# Patient Record
Sex: Female | Born: 1985
Health system: Southern US, Community
[De-identification: ages and names within clinical notes are randomized; demographics above are authoritative.]

## PROBLEM LIST (undated history)

## (undated) DIAGNOSIS — O039 Complete or unspecified spontaneous abortion without complication: Secondary | ICD-10-CM

## (undated) DIAGNOSIS — J45909 Unspecified asthma, uncomplicated: Secondary | ICD-10-CM

## (undated) DIAGNOSIS — F411 Generalized anxiety disorder: Secondary | ICD-10-CM

## (undated) DIAGNOSIS — Q796 Ehlers-Danlos syndrome, unspecified: Secondary | ICD-10-CM

## (undated) DIAGNOSIS — O021 Missed abortion: Secondary | ICD-10-CM

## (undated) DIAGNOSIS — F32A Depression, unspecified: Secondary | ICD-10-CM

## (undated) DIAGNOSIS — B019 Varicella without complication: Secondary | ICD-10-CM

## (undated) DIAGNOSIS — F329 Major depressive disorder, single episode, unspecified: Secondary | ICD-10-CM

## (undated) HISTORY — DX: Unspecified asthma, uncomplicated: J45.909

## (undated) HISTORY — PX: BREAST REDUCTION SURGERY: SHX8

## (undated) HISTORY — DX: Generalized anxiety disorder: F41.1

## (undated) HISTORY — DX: Major depressive disorder, single episode, unspecified: F32.9

## (undated) HISTORY — DX: Depression, unspecified: F32.A

## (undated) HISTORY — DX: Varicella without complication: B01.9

---

## 2007-04-01 HISTORY — PX: INDUCED ABORTION: SHX677

## 2007-04-01 HISTORY — PX: BREAST REDUCTION SURGERY: SHX8

## 2015-03-20 ENCOUNTER — Encounter (INDEPENDENT_AMBULATORY_CARE_PROVIDER_SITE_OTHER): Payer: Self-pay

## 2015-03-20 ENCOUNTER — Encounter: Payer: Self-pay | Admitting: Primary Care

## 2015-03-20 ENCOUNTER — Ambulatory Visit (INDEPENDENT_AMBULATORY_CARE_PROVIDER_SITE_OTHER): Payer: BLUE CROSS/BLUE SHIELD | Admitting: Primary Care

## 2015-03-20 VITALS — BP 120/72 | HR 87 | Temp 98.0°F | Ht 66.75 in | Wt 140.1 lb

## 2015-03-20 DIAGNOSIS — R002 Palpitations: Secondary | ICD-10-CM

## 2015-03-20 DIAGNOSIS — F411 Generalized anxiety disorder: Secondary | ICD-10-CM

## 2015-03-20 DIAGNOSIS — F329 Major depressive disorder, single episode, unspecified: Secondary | ICD-10-CM | POA: Insufficient documentation

## 2015-03-20 DIAGNOSIS — R450 Nervousness: Secondary | ICD-10-CM | POA: Diagnosis not present

## 2015-03-20 DIAGNOSIS — F419 Anxiety disorder, unspecified: Secondary | ICD-10-CM | POA: Insufficient documentation

## 2015-03-20 MED ORDER — ESCITALOPRAM OXALATE 10 MG PO TABS: 10.0000 mg | ORAL_TABLET | Freq: Every day | ORAL | Status: DC

## 2015-03-20 NOTE — Patient Instructions (Addendum)
Complete lab work prior to leaving today. I will notify you of your results once received.   Start Lexapro for anxiety. Take 1/2 tablet by mouth daily for 6 days, then advance to 1 full tablet thereafter. Take this medication in the morning.  Follow up in 6 weeks for re-evaluation.  It was a pleasure to meet you today! Please don't hesitate to call me with any questions. Welcome to [facility]!

## 2015-03-20 NOTE — Progress Notes (Signed)
   Subjective:    Patient ID: Sherry Salas, female    DOB: [birth year], 29 y.o.   MRN: [id number]  HPI  Sherry Salas is a 29 year old female who presents today to establish care and discuss the problems mentioned below. She hasn't seen a PCP in over 5 years nor has she completed a physical.  1) Depression and Anxiety: Diagnosed and managed on medication in college for depression. She was managed on Prozac for one year and came off as she felt improved.   She reports feeling jittery, anxious, nervous for the past year. She has tremendous stress at work as she's been promoted several times and has a lot of responsibility. She's also had bad dreams, palpitations, and muscle tension to her neck and shoulders. Her nervousness/anxiety will show with shaking to her hands and arms that she cannot control. Denies difficulty sleeping at night. GAD 7 score of 5 today.  Review of Systems  Constitutional: Negative for unexpected weight change.  Respiratory: Negative for shortness of breath.   Cardiovascular: Positive for palpitations. Negative for chest pain.  Endocrine: Positive for heat intolerance.  Psychiatric/Behavioral: Negative for suicidal ideas and sleep disturbance. The patient is nervous/anxious.        Past Medical History  Diagnosis Date  . Asthma   . Chickenpox   . Depression   . Generalized anxiety disorder     Social History   Social History  . Marital Status: Married    Spouse Name: N/A  . Number of Children: N/A  . Years of Education: N/A   Occupational History  . Not on file.   Social History Main Topics  . Smoking status: Current Every Day Smoker -- 0.50 packs/day    Types: Cigarettes  . Smokeless tobacco: Not on file  . Alcohol Use: 0.6 oz/week    1 Glasses of wine per week  . Drug Use: Not on file  . Sexual Activity: Not on file   Other Topics Concern  . Not on file   Social History Narrative   Married.   No children.   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     Enjoys painting.     Past Surgical History  Procedure Laterality Date  . Breast reduction surgery      Family History  Problem Relation Age of Onset  . Arthritis Mother   . Stroke Mother   . Depression Mother   . Alcohol abuse Maternal Grandfather   . Lung cancer Maternal Grandfather     Allergies  Allergen Reactions  . Sulfa Antibiotics     No current outpatient prescriptions on file prior to visit.   No current facility-administered medications on file prior to visit.    BP 120/72 mmHg  Pulse 87  Temp(Src) 98 F (36.7 C) (Oral)  Ht 5' 6.75" (1.695 m)  Wt 140 lb 1.9 oz (63.558 kg)  BMI 22.12 kg/m2  SpO2 98%  LMP 03/01/2015    Objective:   Physical Exam  Constitutional: She appears well-nourished.  Eyes: Pupils are equal, round, and reactive to light.  Neck: Neck supple. No thyromegaly present.  Cardiovascular: Normal rate and regular rhythm.   Pulmonary/Chest: Effort normal and breath sounds normal.  Musculoskeletal:  Noticed mild tremor to hands bilaterally. Appears to be a nevous shaking.  Skin: Skin is warm and dry.  Psychiatric: She has a normal mood and affect.          Assessment & Plan:

## 2015-03-20 NOTE — Assessment & Plan Note (Addendum)
Nervousness, anxiety, shaking of upper extremities intermittently x 1 year. Increased stress at work as she has a lot of responsibility.  Seems nervous during exam, obvious shaking of her upper extremities. Also with other symptoms that could be metabolically related. Will obtain TSH, Free T4, CBC, CMP today. No goiter noted.  Start Lexapro 10 mg tablets. Patient is to take 1/2 tablet daily for 6 days, then advance to 1 full tablet thereafter.  We discussed possible side effects of headache, GI upset, drowsiness, and SI/HI. If thoughts of SI/HI develop, we discussed to present to the emergency immediately. Patient verbalized understanding.   Will have her follow up in 6 weeks.

## 2015-03-20 NOTE — Progress Notes (Signed)
Pre visit review using our clinic review tool, if applicable. No additional management support is needed unless otherwise documented below in the visit note. 

## 2015-03-21 LAB — TSH: TSH: 0.33 u[IU]/mL — ABNORMAL LOW (ref 0.35–4.50)

## 2015-03-21 LAB — CBC
HCT: 43.4 % (ref 36.0–46.0)
Hemoglobin: 14.3 g/dL (ref 12.0–15.0)
MCHC: 32.9 g/dL (ref 30.0–36.0)
MCV: 96.2 fl (ref 78.0–100.0)
Platelets: 222 10*3/uL (ref 150.0–400.0)
RBC: 4.51 Mil/uL (ref 3.87–5.11)
RDW: 12.7 % (ref 11.5–15.5)
WBC: 8.1 10*3/uL (ref 4.0–10.5)

## 2015-03-21 LAB — T4, FREE: Free T4: 0.85 ng/dL (ref 0.60–1.60)

## 2015-03-21 LAB — COMPREHENSIVE METABOLIC PANEL
ALT: 17 U/L (ref 0–35)
AST: 25 U/L (ref 0–37)
Albumin: 4.5 g/dL (ref 3.5–5.2)
Alkaline Phosphatase: 52 U/L (ref 39–117)
BILIRUBIN TOTAL: 0.3 mg/dL (ref 0.2–1.2)
BUN: 12 mg/dL (ref 6–23)
CO2: 27 meq/L (ref 19–32)
CREATININE: 0.72 mg/dL (ref 0.40–1.20)
Calcium: 10.2 mg/dL (ref 8.4–10.5)
Chloride: 102 mEq/L (ref 96–112)
GFR: 102.74 mL/min (ref >60.00)
GLUCOSE: 85 mg/dL (ref 70–99)
Potassium: 4.1 mEq/L (ref 3.5–5.1)
SODIUM: 139 meq/L (ref 135–145)
Total Protein: 7.4 g/dL (ref 6.0–8.3)

## 2015-03-22 ENCOUNTER — Encounter: Payer: Self-pay | Admitting: *Deleted

## 2015-05-02 ENCOUNTER — Ambulatory Visit: Payer: BLUE CROSS/BLUE SHIELD | Admitting: Primary Care

## 2015-05-03 ENCOUNTER — Ambulatory Visit (INDEPENDENT_AMBULATORY_CARE_PROVIDER_SITE_OTHER): Payer: BLUE CROSS/BLUE SHIELD | Admitting: Primary Care

## 2015-05-03 VITALS — BP 146/84 | HR 74 | Temp 98.1°F | Ht 67.0 in | Wt 140.1 lb

## 2015-05-03 DIAGNOSIS — F411 Generalized anxiety disorder: Secondary | ICD-10-CM | POA: Diagnosis not present

## 2015-05-03 DIAGNOSIS — R002 Palpitations: Secondary | ICD-10-CM

## 2015-05-03 LAB — TSH: TSH: 1.16 u[IU]/mL (ref 0.35–4.50)

## 2015-05-03 LAB — T4, FREE: FREE T4: 0.9 ng/dL (ref 0.60–1.60)

## 2015-05-03 MED ORDER — ESCITALOPRAM OXALATE 20 MG PO TABS: 20.0000 mg | ORAL_TABLET | Freq: Every day | ORAL | Status: DC

## 2015-05-03 MED ORDER — PROPRANOLOL HCL 10 MG PO TABS: ORAL_TABLET | ORAL | Status: DC

## 2015-05-03 NOTE — Progress Notes (Signed)
   Subjective:    Patient ID: Sherry Salas, female    DOB: [birth year], 30 y.o.   MRN: [id number]  HPI  [name] is a 30 year old female who presents today for follow up of anxiety. She was evaluated as a new patient on 03/20/15 with complaints of nervousness, anxiety, shaking of her upper extremities that had been present for the past year. 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   She was initiated on Lexapro 10 mg tablets last visit due to negative work up for other metabolic causes of tremor and anxiety. Since her last visit she's not noticed a change in her shakiness or anxiety. She will continue to shake everyday, but doesn't feel anxious everyday. Her anxiety will increase exponentially with stress whether it be related to work or family. She will have episodes of anxiety with palpitations, hives, increased shaking.This has occurred about 20 times since her last visit. Denies headaches, GI upset, SI/HI.     Review of Systems  Respiratory: Negative for shortness of breath.   Cardiovascular: Positive for palpitations. Negative for chest pain.  Gastrointestinal: Negative for abdominal pain.  Neurological: Positive for tremors. Negative for headaches.  Psychiatric/Behavioral: Negative for suicidal ideas, sleep disturbance and decreased concentration. The patient is nervous/anxious.        Past Medical History  Diagnosis Date  . Asthma   . Chickenpox   . Depression   . Generalized anxiety disorder     Social History   Social History  . Marital Status: Married    Spouse Name: N/A  . Number of Children: N/A  . Years of Education: N/A   Occupational History  . Not on file.   Social History Main Topics  . Smoking status: Current Every Day Smoker -- 0.50 packs/day    Types: Cigarettes  . Smokeless tobacco: Not on file  . Alcohol Use: 0.6 oz/week    1 Glasses of wine per week  . Drug Use: Not  on file  . Sexual Activity: Not on file   Other Topics Concern  . Not on file   Social History Narrative   Married.   No children.   Works as a Designer, television/film set for [employer].   Enjoys painting.     Past Surgical History  Procedure Laterality Date  . Breast reduction surgery      Family History  Problem Relation Age of Onset  . Arthritis Mother   . Stroke Mother   . Depression Mother   . Alcohol abuse Maternal Grandfather   . Lung cancer Maternal Grandfather     Allergies  Allergen Reactions  . Sulfa Antibiotics     No current outpatient prescriptions on file prior to visit.   No current facility-administered medications on file prior to visit.    BP 146/84 mmHg  Pulse 74  Temp(Src) 98.1 F (36.7 C) (Oral)  Ht  (1.702 m)  Wt 140 lb 1.9 oz (63.558 kg)  BMI 21.94 kg/m2  SpO2 98%  LMP 05/03/2015    Objective:   Physical Exam  Constitutional: She appears well-nourished.  Cardiovascular: Normal rate and regular rhythm.   Pulmonary/Chest: Effort normal and breath sounds normal.  Neurological:  Tremors to upper extremities present today.  Skin: Skin is warm and dry.  Psychiatric: She has a normal mood and affect.          Assessment & Plan:

## 2015-05-03 NOTE — Patient Instructions (Signed)
We've increased the dose of your Lexapro from 10 mg to 20 mg. You may take 2 of your 10 mg tablets until your bottle is empty. I sent a new prescription of the 20 mg to your pharmacy.  Try taking Propranolol 10 mg tablets 1 hour prior to meeting, presentation, event. Please call me if you start feeling dizzy, lightheaded.   Complete lab work prior to leaving today. I will notify you of your results once received.   Follow up in 4 weeks for re-evaluation.  It was a pleasure to see you today!

## 2015-05-03 NOTE — Assessment & Plan Note (Signed)
No improvement with Lexapro 10 mg. 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 Tremor diagnosed as a child, but is more prominent during stressful periods. Will increase Lexapro to 20 mg daily. Add in propranolol 10 mg for her to take just prior to a meeting or presentation to help with palpitations and anxiety. Precautions provided regarding beta blocker. Follow up in 4 weeks.

## 2015-05-03 NOTE — Progress Notes (Signed)
Pre visit review using our clinic review tool, if applicable. No additional management support is needed unless otherwise documented below in the visit note. 

## 2015-05-07 ENCOUNTER — Ambulatory Visit: Payer: BLUE CROSS/BLUE SHIELD | Admitting: Primary Care

## 2015-05-31 ENCOUNTER — Ambulatory Visit: Payer: BLUE CROSS/BLUE SHIELD | Admitting: Primary Care

## 2015-06-04 ENCOUNTER — Ambulatory Visit: Payer: BLUE CROSS/BLUE SHIELD | Admitting: Primary Care

## 2015-06-04 ENCOUNTER — Telehealth: Payer: Self-pay | Admitting: Primary Care

## 2015-06-04 NOTE — Telephone Encounter (Signed)
Yes, please schedule follow up at her earliest convenience. Please do not charge no show fee.

## 2015-06-04 NOTE — Telephone Encounter (Signed)
Patient did not come for their scheduled appointment today for 4 week follow up.  Please let me know if the patient needs to be contacted immediately for follow up or if no follow up is necessary.   ° °

## 2015-06-06 NOTE — Telephone Encounter (Signed)
3/13  Pt aware

## 2015-06-11 ENCOUNTER — Ambulatory Visit (INDEPENDENT_AMBULATORY_CARE_PROVIDER_SITE_OTHER): Payer: BLUE CROSS/BLUE SHIELD | Admitting: Primary Care

## 2015-06-11 ENCOUNTER — Ambulatory Visit: Payer: BLUE CROSS/BLUE SHIELD | Admitting: Primary Care

## 2015-06-11 ENCOUNTER — Encounter: Payer: Self-pay | Admitting: Primary Care

## 2015-06-11 VITALS — BP 118/76 | HR 82 | Temp 98.3°F | Ht 67.0 in | Wt 141.0 lb

## 2015-06-11 DIAGNOSIS — F411 Generalized anxiety disorder: Secondary | ICD-10-CM

## 2015-06-11 DIAGNOSIS — R002 Palpitations: Secondary | ICD-10-CM | POA: Diagnosis not present

## 2015-06-11 MED ORDER — VENLAFAXINE HCL ER 37.5 MG PO CP24: 37.5000 mg | ORAL_CAPSULE | Freq: Every day | ORAL | Status: DC

## 2015-06-11 MED ORDER — PROPRANOLOL HCL 10 MG PO TABS
ORAL_TABLET | ORAL | Status: DC
Start: 2015-06-11 — End: 2015-07-30

## 2015-06-11 NOTE — Progress Notes (Signed)
   Subjective:    Patient ID: Sherry CurryKristen Paul, female    DOB: 01/30/1986, 30 y.o.   MRN: [id number]  HPI  [name] is a 30 year old female who presents today for follow up of anxiety. She is currently managed on Lexapro 20 mg for Generalized Anxiety/Social anxiety that was increased last visit from 10 mg. She is also managed on Propranolol 10 mg that she will take prior to work events/presentations/meetings.  Since her last visit she's noticed a decreased frequency of her anxiety which will occur 2 days weekly as opposed to 5 days weekly. 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 She does not experience this level of anxiety with her staff in the office. She's also not noticed a difference in the propranolol that she's taking 1 hour prior to work events.  She does continue to experience odd dreams since starting the Lexapro. She doesn't feel as though she's getting rest at night due to these odd dreams. Denies SI/HI and panic attacks. Overall she's feeling improved, but continues to feel her bouts of anxiety during her work events.  Review of Systems  Gastrointestinal: Negative for nausea.  Neurological: Negative for headaches.       History of tremor. Increased with anxiety  Psychiatric/Behavioral: Positive for sleep disturbance. Negative for suicidal ideas. The patient is nervous/anxious.        Past Medical History  Diagnosis Date  . Asthma   . Chickenpox   . Depression   . Generalized anxiety disorder     Social History   Social History  . Marital Status: Married    Spouse Name: N/A  . Number of Children: N/A  . Years of Education: N/A   Occupational History  . Not on file.   Social History Main Topics  . Smoking status: Current Every Day Smoker -- 0.50 packs/day    Types: Cigarettes  . Smokeless tobacco: Not on file  . Alcohol Use: 0.6 oz/week    1 Glasses of wine per week  . Drug Use: Not on  file  . Sexual Activity: Not on file   Other Topics Concern  . Not on file   Social History Narrative   Married.   No children.   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   Enjoys painting.     Past Surgical History  Procedure Laterality Date  . Breast reduction surgery      Family History  Problem Relation Age of Onset  . Arthritis Mother   . Stroke Mother   . Depression Mother   . Alcohol abuse Maternal Grandfather   . Lung cancer Maternal Grandfather     Allergies  Allergen Reactions  . Sulfa Antibiotics     No current outpatient prescriptions on file prior to visit.   No current facility-administered medications on file prior to visit.    BP 118/76 mmHg  Pulse 82  Temp(Src) 98.3 F (36.8 C) (Oral)  Ht 5\' 7"  (1.702 m)  Wt 141 lb (63.957 kg)  BMI 22.08 kg/m2  SpO2 99%  LMP 06/08/2015    Objective:   Physical Exam  Constitutional: She appears well-nourished.  Cardiovascular: Normal rate and regular rhythm.   Pulmonary/Chest: Effort normal and breath sounds normal.  Skin: Skin is warm and dry.  Psychiatric: She has a normal mood and affect.          Assessment & Plan:

## 2015-06-11 NOTE — Assessment & Plan Note (Signed)
Somewhat improvement on increased dose of Lexapro, but continues to have anxiety during events. No improvement with propranolol.  Stop Lexapro. Will try Effexor XR once daily.  Increase propranolol to 20 mg one hour prior to events.  She is to update me in 1 month in regards to anxiety. Denies SI/HI.

## 2015-06-11 NOTE — Progress Notes (Signed)
Pre visit review using our clinic review tool, if applicable. No additional management support is needed unless otherwise documented below in the visit note. 

## 2015-06-11 NOTE — Patient Instructions (Signed)
Stop Lexapro as discussed. Start Effexor XR (Venlafaxine) 37.5 mg for anxiety. Take 1 capsule by mouth every morning.   Increase Propranolol to 2 tablets by mouth 1 hour prior to an event or presentation.  Please provide me with an update in 1 month.  It was a pleasure to see you today!

## 2015-06-24 ENCOUNTER — Other Ambulatory Visit: Payer: Self-pay | Admitting: Primary Care

## 2015-06-24 DIAGNOSIS — F418 Other specified anxiety disorders: Secondary | ICD-10-CM

## 2015-06-25 ENCOUNTER — Ambulatory Visit (INDEPENDENT_AMBULATORY_CARE_PROVIDER_SITE_OTHER): Payer: BLUE CROSS/BLUE SHIELD | Admitting: Internal Medicine

## 2015-06-25 ENCOUNTER — Encounter: Payer: Self-pay | Admitting: Internal Medicine

## 2015-06-25 VITALS — BP 120/78 | HR 96 | Temp 98.2°F | Resp 12 | Ht 67.0 in | Wt 139.8 lb

## 2015-06-25 DIAGNOSIS — R05 Cough: Secondary | ICD-10-CM

## 2015-06-25 DIAGNOSIS — J111 Influenza due to unidentified influenza virus with other respiratory manifestations: Secondary | ICD-10-CM | POA: Diagnosis not present

## 2015-06-25 DIAGNOSIS — R059 Cough, unspecified: Secondary | ICD-10-CM

## 2015-06-25 LAB — POCT INFLUENZA A/B
Influenza A, POC: NEGATIVE
Influenza B, POC: NEGATIVE

## 2015-06-25 MED ORDER — OSELTAMIVIR PHOSPHATE 75 MG PO CAPS: 75.0000 mg | ORAL_CAPSULE | Freq: Two times a day (BID) | ORAL | Status: DC

## 2015-06-25 MED ORDER — LEVOFLOXACIN 500 MG PO TABS: 500.0000 mg | ORAL_TABLET | Freq: Every day | ORAL | Status: DC

## 2015-06-25 MED ORDER — HYDROCODONE-GUAIFENESIN 2.5-200 MG/5ML PO SOLN: 5.0000 mL | Freq: Two times a day (BID) | ORAL | Status: DC | PRN

## 2015-06-25 NOTE — Progress Notes (Signed)
Subjective:  Patient ID: Sherry Salas, female    DOB: [birth year]  Age: 30 y.o. MRN: [id number]  CC: The primary encounter diagnosis was Cough. A diagnosis of Influenza was also pertinent to this visit.  HPI Sherry Salas presents for flu like symptoms which began after exposure to two influenza infected patients.   Symptoms of body aches, fevers,  Sweats,  Cough,  Started < 24 hours ago .   Outpatient Prescriptions Prior to Visit  Medication Sig Dispense Refill  . propranolol (INDERAL) 10 MG tablet Take 1-2 tablets by mouth 1 hour prior to presentation or event. 30 tablet 0  . propranolol (INDERAL) 10 MG tablet TAKE 1 TABLET BY MOUTH 1 HOUR PRIOR TO PRESENTATION OR EVENT. 30 tablet 0  . venlafaxine XR (EFFEXOR XR) 37.5 MG 24 hr capsule Take 1 capsule (37.5 mg total) by mouth daily with breakfast. 30 capsule 3   No facility-administered medications prior to visit.    Review of Systems;  Patient denies headache, fevers, malaise, unintentional weight loss, skin rash, eye pain, sinus congestion and sinus pain, sore throat, dysphagia,  hemoptysis , cough, dyspnea, wheezing, chest pain, palpitations, orthopnea, edema, abdominal pain, nausea, melena, diarrhea, constipation, flank pain, dysuria, hematuria, urinary  Frequency, nocturia, numbness, tingling, seizures,  Focal weakness, Loss of consciousness,  Tremor, insomnia, depression, anxiety, and suicidal ideation.      Objective:  BP 120/78 mmHg  Pulse 96  Temp(Src) 98.2 F (36.8 C) (Oral)  Resp 12  Ht 5\' 7"  (1.702 m)  Wt 139 lb 12 oz (63.39 kg)  BMI 21.88 kg/m2  SpO2 99%  LMP 06/08/2015  BP Readings from Last 3 Encounters:  06/25/15 120/78  06/11/15 118/76  05/03/15 146/84    Wt Readings from Last 3 Encounters:  06/25/15 139 lb 12 oz (63.39 kg)  06/11/15 141 lb (63.957 kg)  05/03/15 140 lb 1.9 oz (63.558 kg)    General appearance: alert, cooperative and appears stated age. Sick appearing  Ears: normal TM's and external ear  canals both ears Throat: lips, mucosa, and tongue normal; teeth and gums normal. Tonsillar erythema  Neck: no adenopathy, no carotid bruit, supple, symmetrical, trachea midline and thyroid not enlarged, symmetric, no tenderness/mass/nodules Back: symmetric, no curvature. ROM normal. No CVA tenderness. Lungs: clear to auscultation bilaterally Heart: regular rate and rhythm, S1, S2 normal, no murmur, click, rub or gallop Abdomen: soft, non-tender; bowel sounds normal; no masses,  no organomegaly Pulses: 2+ and symmetric Skin: Skin color, texture, turgor normal. No rashes or lesions Lymph nodes: Cervical, supraclavicular, and axillary nodes tender and enlarged .  No results found for: HGBA1C  Lab Results  Component Value Date   CREATININE 0.72 03/20/2015    Lab Results  Component Value Date   WBC 8.1 03/20/2015   HGB 14.3 03/20/2015   HCT 43.4 03/20/2015   PLT 222.0 03/20/2015   GLUCOSE 85 03/20/2015   ALT 17 03/20/2015   AST 25 03/20/2015   NA 139 03/20/2015   K 4.1 03/20/2015   CL 102 03/20/2015   CREATININE 0.72 03/20/2015   BUN 12 03/20/2015   CO2 27 03/20/2015   TSH 1.16 05/03/2015    Patient was never admitted.  Assessment & Plan:   Problem List Items Addressed This Visit    Influenza    Patient appears to be infected and will treat despite negative rapid influenza results.  Work note, tamiflu, tussionex given.       Relevant Medications   oseltamivir (TAMIFLU) 75 MG capsule  Other Visit Diagnoses    Cough    -  Primary    Relevant Orders    POCT Influenza A/B (Completed)       I am having Ms. Salas start on oseltamivir and levofloxacin. I am also having her maintain her venlafaxine XR, propranolol, and propranolol.  Meds ordered this encounter  Medications  . oseltamivir (TAMIFLU) 75 MG capsule    Sig: Take 1 capsule (75 mg total) by mouth 2 (two) times daily.    Dispense:  10 capsule    Refill:  0  . DISCONTD: HYDROcodone-GuaiFENesin 2.5-200 MG/5ML  SOLN    Sig: Take 5 mLs by mouth every 12 (twelve) hours as needed.    Dispense:  118 mL    Refill:  0  . levofloxacin (LEVAQUIN) 500 MG tablet    Sig: Take 1 tablet (500 mg total) by mouth daily.    Dispense:  7 tablet    Refill:  0    There are no discontinued medications.  Follow-up: No Follow-up on file.   Sherlene Shams, MD

## 2015-06-25 NOTE — Telephone Encounter (Signed)
Last filled 06/11/2015---pt is taking on PRN basis?--is it okay to refill?--please advise

## 2015-06-25 NOTE — Patient Instructions (Signed)
You have the flu is causing bronchitis.    The post nasal drip may also be contributing to  your  Cough.  I am prescribing Tamiflue,  To start tonight and take twice daily for 5 days.  I also advise use of the following OTC meds to help with your other symptoms.   Continue generic OTC benadryl 25 mg  At bedtime for the drainage, Sudafed PE 10 to 30 mg every 6 hours for sinus congestion and a daytime anthistamine for your allergies.  You can use OTC Delsym for daytime cough.  Your coughing may last 7 to 10 days despite using cough suppressants.  I have given you strong cough syrup that has hydrocodone in it   To use for severe cough.   It may constipate you so make sure you use something for that   If you develop  a low grade fever ( T > 100.4, )  but if you develop Green nasal discharge,  Ear  Or facial pain, start the antibiotic I have given you a prescription for  (Levaquin)   Please take a probiotic ( Align, Floraque or Culturelle) if you  Start the antibiotic to prevent a serious antibiotic associated diarrhea  Called clostridium dificile colitis and a vaginal yeast infection

## 2015-06-25 NOTE — Progress Notes (Signed)
Pre-visit discussion using our clinic review tool. No additional management support is needed unless otherwise documented below in the visit note.  

## 2015-06-26 ENCOUNTER — Telehealth: Payer: Self-pay | Admitting: *Deleted

## 2015-06-26 ENCOUNTER — Other Ambulatory Visit: Payer: Self-pay | Admitting: Internal Medicine

## 2015-06-26 DIAGNOSIS — J111 Influenza due to unidentified influenza virus with other respiratory manifestations: Secondary | ICD-10-CM | POA: Insufficient documentation

## 2015-06-26 MED ORDER — BENZONATATE 200 MG PO CAPS: 200.0000 mg | ORAL_CAPSULE | Freq: Three times a day (TID) | ORAL | Status: DC | PRN

## 2015-06-26 MED ORDER — HYDROCOD POLST-CPM POLST ER 10-8 MG/5ML PO SUER: 5.0000 mL | Freq: Every evening | ORAL | Status: DC | PRN

## 2015-06-26 NOTE — Telephone Encounter (Signed)
Patient stated that the pharmacy will not recognize her prescription for HYDROcodone-GuaiFENesin 2.5-200 MG/5ML  [facility]

## 2015-06-26 NOTE — Assessment & Plan Note (Signed)
Patient appears to be infected and will treat despite negative rapid influenza results.  Work note, tamiflu, tussionex given.

## 2015-06-26 NOTE — Telephone Encounter (Signed)
Pt given two new scripts by Dr. Darrick Huntsmanullo in place of the one she could not get filled

## 2015-07-09 ENCOUNTER — Telehealth: Payer: Self-pay | Admitting: Primary Care

## 2015-07-09 NOTE — Telephone Encounter (Signed)
Will you please check on [name]? We switched her Lexapro to Effexor last visit. We also increased her propranolol to 20 mg as needed prior to work events. How's she doing?

## 2015-07-09 NOTE — Telephone Encounter (Signed)
Called patient and asked how she is doing. Patient stated that she doing good right the increased in propranolol and doing okay on Effexor also it is getting more to the stressful time at work so will let us know then any differences.

## 2015-07-09 NOTE — Telephone Encounter (Signed)
Noted  

## 2015-07-28 ENCOUNTER — Other Ambulatory Visit: Payer: Self-pay | Admitting: Primary Care

## 2015-08-27 ENCOUNTER — Other Ambulatory Visit: Payer: Self-pay | Admitting: Primary Care

## 2015-08-27 DIAGNOSIS — F418 Other specified anxiety disorders: Secondary | ICD-10-CM

## 2015-08-28 NOTE — Telephone Encounter (Signed)
Electronically refill request for   propranolol (INDERAL) 10 MG tablet   TAKE 1 TABLET BY MOUTH 1 HOUR PRIOR TO PRESENTATION OR EVENT.  Dispense: 30 tablet   Refills: 0     Last prescribed on 07/30/2015. Last seen on 06/25/2015. No future appt.

## 2015-09-19 ENCOUNTER — Telehealth: Payer: Self-pay

## 2015-09-19 NOTE — Telephone Encounter (Signed)
Pt has been having elevated heart rate (90-100) for awhile; No CP,SOB,H/A; pt had been feeling dizzy but last 2 days no dizziness. Pt scheduled appt 09/21/15 at 2:45 to see Mayra ReelKate Clark NP. This was 1st appt was convenient for pt to come in; if pt condition changes or worsens pt will cb for sooner appt. FYI to Mayra ReelKate Clark NP.

## 2015-09-19 NOTE — Telephone Encounter (Signed)
Noted, appropriate to wait until June 23 as heart rate is still within normal range.

## 2015-09-21 ENCOUNTER — Ambulatory Visit (INDEPENDENT_AMBULATORY_CARE_PROVIDER_SITE_OTHER): Payer: BLUE CROSS/BLUE SHIELD | Admitting: Primary Care

## 2015-09-21 ENCOUNTER — Encounter: Payer: Self-pay | Admitting: Primary Care

## 2015-09-21 VITALS — BP 122/78 | HR 105 | Temp 98.3°F | Ht 67.0 in | Wt 137.4 lb

## 2015-09-21 DIAGNOSIS — F411 Generalized anxiety disorder: Secondary | ICD-10-CM | POA: Diagnosis not present

## 2015-09-21 DIAGNOSIS — R002 Palpitations: Secondary | ICD-10-CM

## 2015-09-21 MED ORDER — PROPRANOLOL HCL ER 60 MG PO CP24: 60.0000 mg | ORAL_CAPSULE | Freq: Every day | ORAL | Status: DC

## 2015-09-21 NOTE — Assessment & Plan Note (Signed)
Increased heart rate and palpitations with Effexor. Could not tolerate Lexapro. Long discussion today in regards to anxiety as she feels overall better off of her medication. Suspect she is experiencing more palpitations than actual anxiety. Will try low-dose extended release propanolol daily for the next month to see if her palpitations and tremor improve.

## 2015-09-21 NOTE — Progress Notes (Signed)
Pre visit review using our clinic review tool, if applicable. No additional management support is needed unless otherwise documented below in the visit note. 

## 2015-09-21 NOTE — Progress Notes (Signed)
   Subjective:    Patient ID: Sherry Salas, female    DOB: Feb 10, 1986, 30 y.o.   MRN: [id number]  HPI  Sherry Salas is a 30 year old female who presents today with a chief complaint of palpitations. She is currently managed on Effexor, that was switched from Lexapro last visit, which has caused an increase in heart rate and palpitations shortly after she consumes. Since she stopped taking Effexor about 1 week ago her HR and palpitations have improvement. She continues to experience palpitations as before and also continues to experience a tremor. Overall she believes her anxiety is much improved. Denies chest pain, shortness of breath, headaches. She has not noticed much of a difference in stress and anxiety when taking propanolol 10 mg prior to her work events.  Review of Systems  Respiratory: Negative for shortness of breath.   Cardiovascular: Positive for palpitations. Negative for chest pain.  Neurological: Negative for dizziness, syncope and headaches.       Past Medical History  Diagnosis Date  . Asthma   . Chickenpox   . Depression   . Generalized anxiety disorder      Social History   Social History  . Marital Status: Married    Spouse Name: N/A  . Number of Children: N/A  . Years of Education: N/A   Occupational History  . Not on file.   Social History Main Topics  . Smoking status: Current Every Day Smoker -- 0.50 packs/day    Types: Cigarettes  . Smokeless tobacco: Not on file  . Alcohol Use: 0.6 oz/week    1 Glasses of wine per week  . Drug Use: Not on file  . Sexual Activity: Not on file   Other Topics Concern  . Not on file   Social History Narrative   Married.   No children.   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   Enjoys painting.     Past Surgical History  Procedure Laterality Date  . Breast reduction surgery      Family History  Problem Relation Age of Onset  . Arthritis Mother   . Stroke Mother   . Depression Mother   . Alcohol abuse  Maternal Grandfather   . Lung cancer Maternal Grandfather     Allergies  Allergen Reactions  . Sulfa Antibiotics     Current Outpatient Prescriptions on File Prior to Visit  Medication Sig Dispense Refill  . oseltamivir (TAMIFLU) 75 MG capsule Take 1 capsule (75 mg total) by mouth 2 (two) times daily. 10 capsule 0  . venlafaxine XR (EFFEXOR XR) 37.5 MG 24 hr capsule Take 1 capsule (37.5 mg total) by mouth daily with breakfast. (Patient not taking: Reported on 09/21/2015) 30 capsule 3   No current facility-administered medications on file prior to visit.    BP 122/78 mmHg  Pulse 105  Temp(Src) 98.3 F (36.8 C) (Oral)  Ht 5\' 7"  (1.702 m)  Wt 137 lb 6.4 oz (62.324 kg)  BMI 21.51 kg/m2  SpO2 98%  LMP 09/02/2015 (Within Days)    Objective:   Physical Exam  Constitutional: She appears well-nourished.  Cardiovascular: Normal rate and regular rhythm.   No murmur heard. Pulmonary/Chest: Effort normal and breath sounds normal.  Skin: Skin is warm and dry.  Psychiatric: She has a normal mood and affect.          Assessment & Plan:

## 2015-09-21 NOTE — Assessment & Plan Note (Signed)
Ongoing. Negative workup. Will try extended release propanolol 60 mg daily to see if that helps.

## 2015-09-21 NOTE — Patient Instructions (Signed)
Start Propranolol ER 60 mg daily. Take 1 tablet by mouth every morning.  Please call me with an update in 2-3 weeks. Also, notify me sooner if you develop weakness, fatigue, dizziness.  It was a pleasure to see you today!

## 2015-10-05 ENCOUNTER — Telehealth: Payer: Self-pay | Admitting: Primary Care

## 2015-10-05 NOTE — Telephone Encounter (Signed)
-----   Message from Doreene NestKatherine K Jenasia Dolinar, NP sent at 09/21/2015  3:05 PM EDT ----- Regarding: Palpitations Has she noticed an improvement in palpitations since we switched to Propranolol ER 60 mg?

## 2015-10-05 NOTE — Telephone Encounter (Signed)
Spoken to patient and she stated that she is doing better with propranolol ER 60 mg. Patient said so far she is doing good.

## 2015-10-05 NOTE — Telephone Encounter (Signed)
Very glad to hear.

## 2015-11-09 ENCOUNTER — Telehealth: Payer: Self-pay

## 2015-11-09 DIAGNOSIS — F411 Generalized anxiety disorder: Secondary | ICD-10-CM

## 2015-11-09 NOTE — Telephone Encounter (Signed)
Pt last seen 09/21/15; recently propranolol was making pt feel like she could feel the veins in her neck pumping/ pt has not taken BP; pt feeling more anxious but thinks work related. No CP,H/A or SOB but last weekend pt did have lightheadedness and pt stopped propranolol last weekend; no lightheadness or feeling veins in neck pumping since stopped med. Pt is aware Mayra ReelKate Clark NP is not in office today and pt request cb next week. If pt condition changes or worsens pt will call office or go to UC. Pt voiced understanding.

## 2015-11-11 NOTE — Telephone Encounter (Signed)
Noted. Agree that it's best to hold Propranolol for now, but this should have been weaned down rather than abruptly stopped. Please notify [name] that we've tried several medication options that seem to cause worsening symptoms. Would she be interested in meeting with a therapist for further evaluation?

## 2015-11-12 NOTE — Telephone Encounter (Signed)
Spoken and notified patient of Kate's comments. Patient verbalized understanding. 

## 2015-11-12 NOTE — Telephone Encounter (Signed)
Spoken and notified patient of Kate's comments. Patient verbalized understanding.  Patient wants to know how she weaned down. Patient stated that she agrees to therapist.

## 2015-11-12 NOTE — Telephone Encounter (Signed)
No need to wean down at this point as she's already been off of medication. I have placed a referral for therapy. Please have her call me if she does not notice improvement with therapy in the future.

## 2015-11-14 ENCOUNTER — Telehealth: Payer: Self-pay | Admitting: Primary Care

## 2015-11-14 NOTE — Telephone Encounter (Signed)
Pt placed on [facility] WQ. Pt will call and scheduled with either Dr. Laymond PurserPerrin or [facility]

## 2015-12-11 ENCOUNTER — Ambulatory Visit (INDEPENDENT_AMBULATORY_CARE_PROVIDER_SITE_OTHER): Payer: BLUE CROSS/BLUE SHIELD | Admitting: Psychology

## 2015-12-11 DIAGNOSIS — F4322 Adjustment disorder with anxiety: Secondary | ICD-10-CM

## 2015-12-16 ENCOUNTER — Other Ambulatory Visit: Payer: Self-pay | Admitting: Primary Care

## 2015-12-16 DIAGNOSIS — R002 Palpitations: Secondary | ICD-10-CM

## 2016-01-01 ENCOUNTER — Ambulatory Visit: Payer: BLUE CROSS/BLUE SHIELD | Admitting: Psychology

## 2016-12-17 ENCOUNTER — Encounter: Payer: Self-pay | Admitting: Family Medicine

## 2016-12-17 ENCOUNTER — Ambulatory Visit (INDEPENDENT_AMBULATORY_CARE_PROVIDER_SITE_OTHER): Payer: BLUE CROSS/BLUE SHIELD | Admitting: Family Medicine

## 2016-12-17 DIAGNOSIS — F411 Generalized anxiety disorder: Secondary | ICD-10-CM

## 2016-12-17 MED ORDER — SERTRALINE HCL 50 MG PO TABS: 50.0000 mg | ORAL_TABLET | Freq: Every day | ORAL | 3 refills | Status: DC

## 2016-12-17 MED ORDER — CLONAZEPAM 0.5 MG PO TABS: 0.2500 mg | ORAL_TABLET | Freq: Two times a day (BID) | ORAL | 0 refills | Status: DC | PRN

## 2016-12-17 NOTE — Progress Notes (Signed)
Marikay Alar, MD Phone: [phone]  Sherry Salas is a 31 y.o. female who presents today for follow-up and establish care visit.  Patient previously seen at our [facility] office by one of the nurse practitioners. She transfers care here to get a second opinion regarding her anxiety. She notes over the last 3 years this has become a significant issue and has become quite debilitating. She was on Prozac for depression in her teenage years though was switched to Lexapro when her anxiety became more of an issue. She had odd dreams on that and then was switched to Effexor which caused palpitations. She reports having had lab workup for that revealing no adverse causes. Notes her anxiety has gotten worse. She has seen a therapist in the past. She was placed on propranolol with little benefit. She notes her anxiety is not that bad when she is on the couch at home though when she is out doing anything it gets worse. Notes work makes her anxious. Reports her anxiety was so bad at some point she asked her husband for a divorce even though she really didn't want to do that. Really anything now is making her anxious. She feels it is somewhat irrational. Has had 3 panic attacks over the last 3 years which are described as shaking, increased heart rate, sweating, and face and body getting red. No SI or HI. No depression.   ROS see history of present illness  Objective  Physical Exam Vitals:   12/17/16 1357 12/17/16 1424  BP: 124/82   Pulse: (!) 122 84  Temp: 98.9 F (37.2 C)   SpO2: 97%     BP Readings from Last 3 Encounters:  12/17/16 124/82  09/21/15 122/78  06/25/15 120/78   Wt Readings from Last 3 Encounters:  12/17/16 137 lb 9.6 oz (62.4 kg)  09/21/15 137 lb 6.4 oz (62.3 kg)  06/25/15 139 lb 12 oz (63.4 kg)    Physical Exam  Constitutional: No distress.  Cardiovascular:  Tachycardic initially though improved, regular rhythm, normal heart sounds  Pulmonary/Chest: Effort normal and  breath sounds normal.  Neurological: She is alert. Gait normal.  Slight tremor/shaking noted  Skin: Skin is warm and dry. She is not diaphoretic.  Psychiatric:  Mood anxious, affect anxious     Assessment/Plan: Please see individual problem list.  GAD (generalized anxiety disorder) Patient with fairly significant anxiety. This has been going on for several years with only some benefit from prior medications. She notes no depression currently. Has been intolerant of several SSRIs previously. Suspect elevated heart rate is related to anxiety. Discussed lab evaluation and EKG given elevated heart rate though she defer these and opted for treatment of anxiety first. We will start on Zoloft. We'll provide with a small dose of Klonopin for her to take for her anxiety as the Zoloft takes time to kick in. Discussed with the Klonopin could make her drowsy and to not drive while taking this until she knows it makes her drowsy. Discussed risk of weight gain and sexual side effects with Zoloft. She'll follow-up in 4 weeks. She's given return precautions. LMP earlier this month. Discussed if she were to miss a menstrual cycle she needs to let us know given the medication that we started her on.  No orders of the defined types were placed in this encounter.   Meds ordered this encounter  Medications  . sertraline (ZOLOFT) 50 MG tablet    Sig: Take 1 tablet (50 mg total) by mouth daily.  Dispense:  30 tablet    Refill:  3  . clonazePAM (KLONOPIN) 0.5 MG tablet    Sig: Take 0.5 tablets (0.25 mg total) by mouth 2 (two) times daily as needed for anxiety.    Dispense:  20 tablet    Refill:  0   Marikay Alar, MD [facility]

## 2016-12-17 NOTE — Patient Instructions (Signed)
Nice to meet you. We will start you on Zoloft for your anxiety. This may take 4-8 weeks to start to work. We will provide you with a small supply of Klonopin to take as needed for anxiety. Please be wary as this may make you drowsy or impaired. Please do not drink this with alcohol. If your anxiety worsens significantly please let us know. If you develop depression or thoughts of harming yourself or others please be evaluated immediately.

## 2016-12-17 NOTE — Assessment & Plan Note (Signed)
Patient with fairly significant anxiety. This has been going on for several years with only some benefit from prior medications. She notes no depression currently. Has been intolerant of several SSRIs previously. Suspect elevated heart rate is related to anxiety. Discussed lab evaluation and EKG given elevated heart rate though she defer these and opted for treatment of anxiety first. We will start on Zoloft. We'll provide with a small dose of Klonopin for her to take for her anxiety as the Zoloft takes time to kick in. Discussed with the Klonopin could make her drowsy and to not drive while taking this until she knows it makes her drowsy. Discussed risk of weight gain and sexual side effects with Zoloft. She'll follow-up in 4 weeks. She's given return precautions.

## 2016-12-24 ENCOUNTER — Telehealth: Payer: Self-pay | Admitting: Family Medicine

## 2016-12-24 NOTE — Telephone Encounter (Signed)
Patient states she takes 0.5mg  in the morning and 0.5 mg after lunch. It has improved slightly

## 2016-12-24 NOTE — Telephone Encounter (Signed)
Please advise 

## 2016-12-24 NOTE — Telephone Encounter (Signed)
It may take some time for her symptoms to improve quite a bit. It can take up to 4-8 weeks for significant improvement. Please check to see how often she takes them throughout the day. Is she is taking 1 mg total at once or she taking 0.5 mg twice a day? Please see if her symptoms are overall improving. Thanks.

## 2016-12-24 NOTE — Telephone Encounter (Signed)
The patient should give it another couple of days and if not improving she should let us know. Could consider going up on her dose at that time. She should not go up on the dose without our direction.

## 2016-12-24 NOTE — Telephone Encounter (Signed)
Pt called and stated that she has been taking 2 full tablets of the clonazePAM (KLONOPIN) 0.5 MG tablet for 4 days now and she is still feeling panicked. Please advise, thank you!  Call pt @ [phone]

## 2016-12-25 NOTE — Telephone Encounter (Signed)
Patient notified

## 2016-12-29 ENCOUNTER — Telehealth: Payer: Self-pay | Admitting: *Deleted

## 2016-12-29 NOTE — Telephone Encounter (Signed)
Pt requested directions on how to take her clonazepam. Pt would like proper dosage.  Pt contact [phone]

## 2016-12-29 NOTE — Telephone Encounter (Signed)
She can take 1 tablet twice daily as needed for anxiety. If this is not beneficial over the next 3 days she can take 1.5 tablets twice daily as needed for anxiety. She should be wary as this increase could potentially make her drowsy.

## 2016-12-29 NOTE — Telephone Encounter (Signed)
Left message to return call 

## 2016-12-29 NOTE — Telephone Encounter (Signed)
Patient states she takes 1 full tablet in the morning and either 1 or 0.5 in the afternoon depending on how she feels.

## 2016-12-29 NOTE — Telephone Encounter (Signed)
Please contact the patient and see how much she is taking at this time and then I can give directions. Thanks.

## 2016-12-29 NOTE — Telephone Encounter (Signed)
Please advise 

## 2016-12-30 MED ORDER — CLONAZEPAM 0.5 MG PO TABS: 0.7500 mg | ORAL_TABLET | Freq: Two times a day (BID) | ORAL | 0 refills | Status: DC | PRN

## 2016-12-30 NOTE — Telephone Encounter (Signed)
faxed

## 2016-12-30 NOTE — Telephone Encounter (Signed)
Please fax

## 2016-12-30 NOTE — Telephone Encounter (Signed)
Patient notified and states it has been very beneficial, she does need a refill. Patient would like to stay at 1.5 tabs daily. [facility]

## 2017-01-19 ENCOUNTER — Encounter: Payer: Self-pay | Admitting: Family Medicine

## 2017-01-19 ENCOUNTER — Ambulatory Visit (INDEPENDENT_AMBULATORY_CARE_PROVIDER_SITE_OTHER): Payer: BLUE CROSS/BLUE SHIELD | Admitting: Family Medicine

## 2017-01-19 DIAGNOSIS — F411 Generalized anxiety disorder: Secondary | ICD-10-CM | POA: Diagnosis not present

## 2017-01-19 MED ORDER — SERTRALINE HCL 100 MG PO TABS: 100.0000 mg | ORAL_TABLET | Freq: Every day | ORAL | 1 refills | Status: DC

## 2017-01-19 MED ORDER — CLONAZEPAM 0.5 MG PO TABS: 0.7500 mg | ORAL_TABLET | Freq: Two times a day (BID) | ORAL | 0 refills | Status: DC | PRN

## 2017-01-19 NOTE — Assessment & Plan Note (Signed)
Increase Zoloft to 100 mg daily. She'll continue Klonopin as needed. Discussed that if she gets to the point where she does not need Klonopin as much she can decrease to half a tablet twice daily for one week and then half a tablet daily for one week and then discontinue. Follow-up in 3 months.

## 2017-01-19 NOTE — Progress Notes (Signed)
  Marikay AlarEric Jay Kempe, MD Phone: [phone]  Donnalee CurryKristen Salas is a 31 y.o. female who presents today for follow-up.  Anxiety: This is a 75% better. Taking Zoloft 50 mg. Takes Klonopin 0.25 mg typically 3 times a day. Occasionally goes up to 1 mg twice a day if she has a particularly stressful day. She is able to handle things significant better. No depression.  ROS see history of present illness  Objective  Physical Exam Vitals:   01/19/17 1122  BP: 120/68  Pulse: 81  Temp: 98.6 F (37 C)  SpO2: 99%    BP Readings from Last 3 Encounters:  01/19/17 120/68  12/17/16 124/82  09/21/15 122/78   Wt Readings from Last 3 Encounters:  01/19/17 133 lb 12.8 oz (60.7 kg)  12/17/16 137 lb 9.6 oz (62.4 kg)  09/21/15 137 lb 6.4 oz (62.3 kg)    Physical Exam  Constitutional: No distress.  Cardiovascular: Normal rate, regular rhythm and normal heart sounds.   Pulmonary/Chest: Effort normal and breath sounds normal.  Skin: She is not diaphoretic.  Psychiatric:  Mood anxious, affect anxious but significantly improved     Assessment/Plan: Please see individual problem list.  GAD (generalized anxiety disorder) Increase Zoloft to 100 mg daily. She'll continue Klonopin as needed. Discussed that if she gets to the point where she does not need Klonopin as much she can decrease to half a tablet twice daily for one week and then half a tablet daily for one week and then discontinue. Follow-up in 3 months.   No orders of the defined types were placed in this encounter.   Meds ordered this encounter  Medications  . clonazePAM (KLONOPIN) 0.5 MG tablet    Sig: Take 1.5 tablets (0.75 mg total) by mouth 2 (two) times daily as needed for anxiety.    Dispense:  40 tablet    Refill:  0  . sertraline (ZOLOFT) 100 MG tablet    Sig: Take 1 tablet (100 mg total) by mouth daily.    Dispense:  90 tablet    Refill:  1    Marikay AlarEric Naliah Eddington, MD [facility] [place]

## 2017-01-19 NOTE — Patient Instructions (Signed)
Nice to see you. I'm glad you're doing better. We'll increase your Zoloft to 100 mg daily. Please take two 50 mg tablets daily until you run out of your current prescription. If you get to the point where you feel you do not need the Klonopin as much please decrease to half a tablet twice daily for at least a week and then decrease to 1 tablet daily for at least a week.

## 2017-03-02 ENCOUNTER — Other Ambulatory Visit: Payer: Self-pay | Admitting: Family Medicine

## 2017-03-02 ENCOUNTER — Telehealth: Payer: Self-pay | Admitting: Family Medicine

## 2017-03-02 MED ORDER — CLONAZEPAM 0.5 MG PO TABS: 0.5000 mg | ORAL_TABLET | Freq: Two times a day (BID) | ORAL | 0 refills | Status: DC | PRN

## 2017-03-02 NOTE — Telephone Encounter (Signed)
Patient would like to have instructions to taper and would also like to increase zoloft. Rx for Klonopin has been printed, signed, faxed.

## 2017-03-02 NOTE — Telephone Encounter (Signed)
Patient said that she was weaning herself off of the Klonopin but now that she is out of the medication she feels very anxious and panics very easily. No other symptoms noted. Patient says she feels like the Zoloft is not helping her at all. She had to leave work early today due to increased anxiety.

## 2017-03-02 NOTE — Telephone Encounter (Signed)
See last message below

## 2017-03-02 NOTE — Telephone Encounter (Signed)
Copied from CRM [id number]. Topic: Quick Communication - See Telephone Encounter >> Mar 02, 2017  2:12 PM Sherry Salas, Sherry Salas wrote: CRM for notification. See Telephone encounter for:  PT takes klonopin and has been out since Friday 11/30,  she is unsure if she is suppose to start back up now since she has been off it a few days,  and Zoloft has not been helping.  She wants to know what Dr Birdie SonsSonnenberg wants her to do 03/02/17.

## 2017-03-02 NOTE — Telephone Encounter (Signed)
Please see last unrouted message below

## 2017-03-02 NOTE — Telephone Encounter (Signed)
Attempted to reach patient to see if she was having any symptoms since she has been without klonopin since 11/30. Left message to call back.

## 2017-03-02 NOTE — Telephone Encounter (Signed)
Patient should start back on the Klonopin.  She may need a slower taper she will likely need a directed taper from us as it does not appear that she had a directed taper.  We will restart her on it at 0.5 mg twice daily.  If she would like to taper please let me know and I can provide instructions.  We could consider increasing her Zoloft as well.  Thanks.

## 2017-03-03 MED ORDER — SERTRALINE HCL 100 MG PO TABS: 150.0000 mg | ORAL_TABLET | Freq: Every day | ORAL | 1 refills | Status: DC

## 2017-03-03 NOTE — Telephone Encounter (Signed)
Patient should take clonazepam 0.5 mg by mouth twice daily for 2 weeks, then decrease to 0.25 mg by mouth twice daily for 2 weeks, then decrease to 0.25 mg by mouth daily for 2 weeks, then discontinue.  Zoloft sent to pharmacy.

## 2017-03-03 NOTE — Telephone Encounter (Signed)
Spoke with patient. She stated she would call back when she was somewhere that she could write taper directions down.

## 2017-03-04 NOTE — Telephone Encounter (Signed)
Patient was notified of taper directions and advised to call our office with any questions or concerns

## 2017-04-21 ENCOUNTER — Other Ambulatory Visit: Payer: Self-pay | Admitting: Family Medicine

## 2017-04-21 MED ORDER — CLONAZEPAM 0.5 MG PO TABS: 0.5000 mg | ORAL_TABLET | Freq: Two times a day (BID) | ORAL | 0 refills | Status: DC | PRN

## 2017-04-21 NOTE — Telephone Encounter (Signed)
Copied from CRM #[id number]. [id number]: Quick Communication - Rx Refill/Question >> Apr 21, 2017  2:33 PM Rudi CocoLathan, Lajoy Vanamburg M, NT wrote: Medication:clonazepam Has the patient contacted their pharmacy?no (Agent: If no, request that the patient contact the pharmacy for the refill.) Preferred Pharmacy (with phone number or street name):  Agent: Please be advised that RX refills may take up to 3 business days. We ask that you follow-up with your pharmacy.

## 2017-04-21 NOTE — Telephone Encounter (Signed)
Refill request for Clonazepam, last seen 22OCT2018, last filled 3DEC2018.  Please advise.

## 2017-04-22 NOTE — Telephone Encounter (Signed)
faxed

## 2017-04-27 ENCOUNTER — Ambulatory Visit: Payer: BLUE CROSS/BLUE SHIELD | Admitting: Family Medicine

## 2017-05-04 ENCOUNTER — Ambulatory Visit: Payer: Self-pay | Admitting: *Deleted

## 2017-05-04 NOTE — Telephone Encounter (Signed)
Patient is calling to report she is having a really hard time with her depression. She needs to talk to her provider about her medication- it is making her worse. She had to a motel room last night because she could not stay at home. Patient is requesting to see her provider- but will see anyone today because she is desperate for help. She can be reached at [phone] and she is available anytime after 2:00 for appointment Reason for Disposition . [1] Depression AND [2] worsening (e.g.,sleeping poorly, less able to do activities of daily living)  Answer Assessment - Initial Assessment Questions 1. CONCERN: "What happened that made you call today?"     Medication management 2. DEPRESSION SYMPTOM SCREENING: "How are you feeling overall?" (e.g., decreased energy, increased sleeping or difficulty sleeping, difficulty concentrating, feelings of sadness, guilt, hopelessness, or worthlessness)     Difficultly sleeping- crazy dreams, concentrating, feeling of sadness, hopelessness, worthlessness 3. RISK OF HARM - SUICIDAL IDEATION:  "Do you ever have thoughts of hurting or killing yourself?"  (e.g., yes, no, no but preoccupation with thoughts about death)   - INTENT:  "Do you have thoughts of hurting or killing yourself right NOW?" (e.g., yes, no, N/A) no   - PLAN: "Do you have a specific plan for how you would do this?" (e.g., gun, knife, overdose, no plan, N/A)     no 4. RISK OF HARM - HOMICIDAL IDEATION:  "Do you ever have thoughts of hurting or killing someone else?"  (e.g., yes, no, no but preoccupation with thoughts about death) no   - INTENT:  "Do you have thoughts of hurting or killing someone right NOW?" (e.g., yes, no, N/A)   - PLAN: "Do you have a specific plan for how you would do this?" (e.g., gun, knife, no plan, N/A)      no 5. FUNCTIONAL IMPAIRMENT: "How have things been going for you overall in your life? Have you had any more difficulties than usual doing your normal daily activities?"   (e.g., better, same, worse; self-care, school, work, interactions)     No big changes- no changes at work or in relationship 6. SUPPORT: "Who is with you now?" "Who do you live with?" "Do you have family or friends nearby who you can talk to?"      At work now- husband    Patient does not share with anyone 7. THERAPIST: "Do you have a counselor or therapist? Name?"     No not at this time 8. STRESSORS: "Has there been any new stress or recent changes in your life?"    No changes 9. DRUG ABUSE/ALCOHOL: "Do you drink alcohol or use any illegal drugs?"      Social- with dinner  No drugs 10. OTHER: "Do you have any other health or medical symptoms right now?" (e.g., fever)       no 11. PREGNANCY: "Is there any chance you are pregnant?" "When was your last menstrual period?"       No  LMP- current  Protocols used: DEPRESSION-A-AH

## 2017-05-04 NOTE — Telephone Encounter (Signed)
Spoke with Dr Birdie SonsSonnenberg and he stated that he could call her back this afternoon after 5:00pm or he could see her Wednesday afternoon.  I again I asked her if she had any thoughts of harming herself and she said no.  She thought it was just a medication problem,. She would prefer Dr Birdie SonsSonnenberg call her back this afternoon after 5:00pm at number listed below [phone]. Thanks.
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
## 2017-05-04 NOTE — Telephone Encounter (Signed)
Spoke with the patient regarding her symptoms. She notes her anxiety is not better. She has actually started to feel depressed. Has been getting worse over a period of several weeks. She notes no SI. She feels her symptoms are related to the zoloft she is on.   Please contact the patient to let her know we will decrease her dose of zoloft to 50 mg daily for one week then she will discontinue it. At that time we will switch her to lexapro. I will send in a new prescription for her once you speak with her. She can continue the klonopin. Thanks.

## 2017-05-05 NOTE — Telephone Encounter (Signed)
Patient notified and verbalized understanding. 

## 2017-05-11 ENCOUNTER — Telehealth: Payer: Self-pay

## 2017-05-11 MED ORDER — ESCITALOPRAM OXALATE 10 MG PO TABS: 10.0000 mg | ORAL_TABLET | Freq: Every day | ORAL | 1 refills | Status: DC

## 2017-05-11 NOTE — Telephone Encounter (Signed)
Patient would like the new rx for escitalopram as discussed in other telephone encounter.

## 2017-05-11 NOTE — Telephone Encounter (Signed)
Lexapro sent to pharmacy. 

## 2017-05-27 ENCOUNTER — Other Ambulatory Visit: Payer: Self-pay | Admitting: Family Medicine

## 2017-05-28 ENCOUNTER — Telehealth: Payer: Self-pay | Admitting: Family Medicine

## 2017-05-28 NOTE — Telephone Encounter (Signed)
Copied from CRM [id number]. Topic: Quick Communication - Rx Refill/Question >> May 28, 2017  2:27 PM Sherry Salas, Sherry Salas wrote: Sherry Salas states she just received faxed order for clonazePAM for Sherry Salas - she states faxed RX is no longer valid - please phone in or e-scribe

## 2017-05-28 NOTE — Telephone Encounter (Signed)
Last OV 01/19/17 last filled 04/21/17 40 0rf Patient states she has 1 tablet left and she takes 1.5 tablets daily.

## 2017-05-28 NOTE — Telephone Encounter (Signed)
faxed

## 2017-05-28 NOTE — Telephone Encounter (Signed)
Called pharmacy and gave verbal

## 2017-06-01 ENCOUNTER — Ambulatory Visit (INDEPENDENT_AMBULATORY_CARE_PROVIDER_SITE_OTHER): Payer: BLUE CROSS/BLUE SHIELD | Admitting: Family Medicine

## 2017-06-01 ENCOUNTER — Encounter: Payer: Self-pay | Admitting: Family Medicine

## 2017-06-01 ENCOUNTER — Other Ambulatory Visit: Payer: Self-pay

## 2017-06-01 VITALS — BP 128/70 | HR 103 | Temp 98.1°F | Wt 140.8 lb

## 2017-06-01 DIAGNOSIS — F329 Major depressive disorder, single episode, unspecified: Secondary | ICD-10-CM | POA: Diagnosis not present

## 2017-06-01 DIAGNOSIS — F411 Generalized anxiety disorder: Secondary | ICD-10-CM | POA: Diagnosis not present

## 2017-06-01 DIAGNOSIS — F419 Anxiety disorder, unspecified: Secondary | ICD-10-CM

## 2017-06-01 NOTE — Assessment & Plan Note (Addendum)
Depression was new though has resolved since switching medication.  Improving control of anxiety.  Has improved since switched to Lexapro.  This has been more tolerable.  She will continue this.  Continue Klonopin.  Will refer to see a therapist.  Follow-up in 2 months.

## 2017-06-01 NOTE — Patient Instructions (Signed)
Nice to see you. We will refer you for therapy. Please continue the Lexapro. If you need a refill on Klonopin please let us know. If you develop any side effects or worsening symptoms of anxiety please let us know.

## 2017-06-01 NOTE — Progress Notes (Signed)
  Marikay AlarEric Kamara Allan, MD Phone: [phone]  Donnalee CurryKristen Salas is a 32 y.o. female who presents today for follow-up.  Anxiety/depression: Patient notes the depression has gone completely away since coming off of Zoloft.  She felt like she was in a hole.  She notes no SI.  She had a panic attack on the Zoloft.  She has done well since coming off of this and switching to Lexapro.  Notes work and her relationships induce some anxiety.  She takes Klonopin 1-2 times a day and that has been helpful.  She would like to see a therapist.  Social History   Tobacco Use  Smoking Status Current Every Day Smoker  . Packs/day: 0.50  . Types: Cigarettes  Smokeless Tobacco Never Used     ROS see history of present illness  Objective  Physical Exam Vitals:   06/01/17 1317  BP: 128/70  Pulse: (!) 103  Temp: 98.1 F (36.7 C)  SpO2: 98%    BP Readings from Last 3 Encounters:  06/01/17 128/70  01/19/17 120/68  12/17/16 124/82   Wt Readings from Last 3 Encounters:  06/01/17 140 lb 12.8 oz (63.9 kg)  01/19/17 133 lb 12.8 oz (60.7 kg)  12/17/16 137 lb 9.6 oz (62.4 kg)    Physical Exam  Constitutional: She is well-developed, well-nourished, and in no distress.  Cardiovascular: Normal rate, regular rhythm and normal heart sounds.  Pulmonary/Chest: Effort normal and breath sounds normal.     Assessment/Plan: Please see individual problem list.  Anxiety and depression Depression was new though has resolved since switching medication.  Improving control of anxiety.  Has improved since switched to Lexapro.  This has been more tolerable.  She will continue this.  Continue Klonopin.  Will refer to see a therapist.  Follow-up in 2 months.   Orders Placed This Encounter  Procedures  . Ambulatory referral to Psychology    Referral Priority:   Routine    Referral Type:   Psychiatric    Referral Reason:   Specialty Services Required    Requested Specialty:   Psychology    Number of Visits Requested:    1    No orders of the defined types were placed in this encounter.    Marikay AlarEric Germani Gavilanes, MD [facility] [place]

## 2017-06-22 ENCOUNTER — Other Ambulatory Visit: Payer: Self-pay | Admitting: Internal Medicine

## 2017-06-23 ENCOUNTER — Ambulatory Visit (INDEPENDENT_AMBULATORY_CARE_PROVIDER_SITE_OTHER): Payer: BLUE CROSS/BLUE SHIELD | Admitting: Psychology

## 2017-06-23 DIAGNOSIS — F3289 Other specified depressive episodes: Secondary | ICD-10-CM

## 2017-06-23 DIAGNOSIS — F411 Generalized anxiety disorder: Secondary | ICD-10-CM

## 2017-06-24 NOTE — Telephone Encounter (Signed)
Last OV 06/01/2017 Next OV 08/04/2017 Last refill 05/28/2017

## 2017-07-22 ENCOUNTER — Other Ambulatory Visit: Payer: Self-pay | Admitting: Family Medicine

## 2017-07-22 NOTE — Telephone Encounter (Signed)
Okay to refill?  Last filled on 06/25/17 for #40 with no refills.  LOV: 06/01/17 NOV: 08/04/17

## 2017-07-28 ENCOUNTER — Ambulatory Visit: Payer: BLUE CROSS/BLUE SHIELD | Admitting: Psychology

## 2017-08-04 ENCOUNTER — Ambulatory Visit: Payer: BLUE CROSS/BLUE SHIELD | Admitting: Family Medicine

## 2017-08-19 ENCOUNTER — Other Ambulatory Visit: Payer: Self-pay | Admitting: Family Medicine

## 2017-08-19 NOTE — Telephone Encounter (Signed)
Last OV 06/01/17 last filled 07/23/17 40 0rf

## 2017-11-05 ENCOUNTER — Other Ambulatory Visit: Payer: Self-pay | Admitting: Family Medicine

## 2017-11-13 ENCOUNTER — Other Ambulatory Visit: Payer: Self-pay | Admitting: Family Medicine

## 2017-11-13 NOTE — Telephone Encounter (Signed)
Sent to pharmacy.  Patient needs to schedule follow-up.  Thanks.

## 2017-11-13 NOTE — Telephone Encounter (Signed)
Last OV 06/01/17 last filled 08/19/17 40 0rf

## 2017-11-16 NOTE — Telephone Encounter (Signed)
Informed patient she will need to schedule a follow up for further refills

## 2018-06-03 ENCOUNTER — Ambulatory Visit: Payer: Self-pay

## 2018-06-03 NOTE — Telephone Encounter (Signed)
Incoming call form Patient with a complaint of depression.  Patient states that she woke up this morning at 5:00am writing a contact list.  Patient  States she has decreased energy, feelings of sadness states " I'm just in a rutt".  Does not have thought of hurting herself.  States sometimes  She just wants to sleep. . Lives alone.  Does not have a therapist. Patient states that she lost her job. Yet she has acquired a new job.  Could not get her medications.  Her Mother terminally ill and going through a divorce.   Patient drinks wine. Denies any other health  Issue.  Patient does work out.   Reviewed protocol.  Attempted to schedule appointment for tomorrow.  No availability.  Called  Flow coordinator for assistance.  Related to Patient that flow coordinator will be  contacting her. Patient voiced understanding.  Reason for Disposition . Sometimes has thoughts of suicide  Answer Assessment - Initial Assessment Questions 1. CONCERN: "What happened that made you call today?"     Woke up  At 5:00  And wrote  A contact list.   2. DEPRESSION SYMPTOM SCREENING: "How are you feeling overall?" (e.g., decreased energy, increased sleeping or difficulty sleeping, difficulty concentrating, feelings of sadness, guilt, hopelessness, or worthlessness; problems caring for baby)     Decreased energy, feelings of sadness, " Im just in a rutt" 3. RISK OF HARM - SUICIDAL IDEATION:  "Do you ever have thoughts of hurting yourself or the baby?"  (e.g., yes, no; details).   - INTENT:  "Do you have thoughts of hurting yourself or the baby right NOW?" (e.g., yes, no, N/A)   - PLAN: "Do you have a specific plan for how you would do this?" (e.g., gun, knife, overdose, no plan, N/A)     no 4. RISK OF HARM - HOMICIDAL IDEATION:  "Do you ever have thoughts of hurting or killing someone else?"  (e.g., yes, no, no but preoccupation with thoughts about death)  Have  Thoughts of thought of not  Waking up   - INTENT:  "Do you have  thoughts of hurting or killing someone right NOW?" (e.g., yes, no, N/A)   - PLAN: "Do you have a specific plan for how you would do this?" (e.g., gun, knife, no plan, N/A)      *No Answer* 5. FUNCTIONAL IMPAIRMENT: "How have things been going for you overall in your life? Have you had any more difficulties than usual doing your normal daily activities?"  (e.g., better, same, worse; self-care, school, work, interactions)     Got  A new job   6. SUPPORT: "Who is with you now?" "Who do you live with?" "Do you have family or friends nearby who you can talk to?"      Live alone 7. THERAPIST: "Do you have a counselor or therapist? Name?"     no 8. STRESSORS: "Has there been any new stress or recent changes in your life?"      mother is sick [place] 9. DRUG ABUSE/ALCOHOL: "Do you drink alcohol or use any illegal drugs?"      Drink  wine 10. OTHER: "Do you have any other health or medical symptoms right now?" (e.g., fever)       Nope , workout11. DELIVERY DATE: "When was your delivery date?"  Protocols used: POSTPARTUM - DEPRESSION-A-AH

## 2018-06-03 NOTE — Telephone Encounter (Signed)
Yes please do well person check and have her go to ER for evaluation and/or come in to clinic.  If she is feeling very bad, lexapro will not kick in right away and ER would be best place for fast medication adjustments
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
## 2018-06-04 NOTE — Telephone Encounter (Signed)
Tried to reach patient this morning she is not answering and no voicemail.
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
## 2018-06-04 NOTE — Telephone Encounter (Signed)
Tried to call patient all day no answer no voicemail contacted [place] to sse outcome they had transferred too  [place] due to out of they're jurisdiction. Called  thya could not tell me if an officer went out to check on patient , so they filed new report and are having someone check on patient again tonight.

## 2018-06-06 NOTE — Telephone Encounter (Signed)
Please follow-up with the patient ad see how she is doing. Please offer her an appointment with me this week if there is one available. You could use a 4:30 time slot on Monday, Tuesday, Wednesday, or Thursday. Thanks.

## 2018-06-07 NOTE — Telephone Encounter (Signed)
Please call patient today to check on her

## 2018-06-07 NOTE — Telephone Encounter (Signed)
Called pt and was unable to leave a VM due to mail box being full when calling mobile number.

## 2018-06-07 NOTE — Telephone Encounter (Signed)
Called pt's husband and was unable to leave a VM to call back.

## 2018-06-08 NOTE — Telephone Encounter (Signed)
Called pt and was able to leave a VM to call back. CRM created and sent to PCP pool.

## 2018-06-09 ENCOUNTER — Telehealth: Payer: Self-pay

## 2018-06-09 NOTE — Telephone Encounter (Signed)
Reason for CRM:   Patient needs a anxiety and depression appt with Dr. Birdie Sons.  She was on medication and lost her insurance, so she hasn't had any meds in a while.  She needs to get in to see him, but there are no appt for months.  Please advise.  If no answer, leave voicemail and she will return the call.   Please advise when you would like for me to schedule pt for an appt. Thanks

## 2018-06-09 NOTE — Telephone Encounter (Signed)
Duplicated message has been sent to PCP to find out when we could fit pt into his schedule.

## 2018-06-09 NOTE — Telephone Encounter (Signed)
I can see her tomorrow at 3:15

## 2018-06-09 NOTE — Telephone Encounter (Signed)
Copied from CRM [id number]. Topic: General - Other >> Jun 09, 2018 10:03 AM Trula Slade wrote: Reason for CRM:   Patient needs a anxiety and depression appt with Dr. Birdie Sons.  She was on medication and lost her insurance, so she hasn't had any meds in a while.  She needs to get in to see him, but there are no appt for months.  Please advise.  If no answer, leave voicemail and she will return the call.

## 2018-06-10 NOTE — Telephone Encounter (Signed)
Called pt and left a VM to call back to schedule for an appt advise we have an opening today. CRM created and sent to [facility] pool.

## 2018-06-15 NOTE — Telephone Encounter (Signed)
Called pt and left a VM to call back. CRM created and sent to [facility] pool. 

## 2018-06-17 ENCOUNTER — Telehealth: Payer: Self-pay | Admitting: Family Medicine

## 2018-06-17 ENCOUNTER — Ambulatory Visit: Payer: Self-pay | Admitting: Family Medicine

## 2018-06-17 ENCOUNTER — Encounter: Payer: Self-pay | Admitting: Family Medicine

## 2018-06-17 ENCOUNTER — Other Ambulatory Visit: Payer: Self-pay

## 2018-06-17 VITALS — BP 130/70 | HR 92 | Temp 98.2°F | Ht 67.0 in | Wt 147.8 lb

## 2018-06-17 DIAGNOSIS — F329 Major depressive disorder, single episode, unspecified: Secondary | ICD-10-CM

## 2018-06-17 DIAGNOSIS — F419 Anxiety disorder, unspecified: Secondary | ICD-10-CM

## 2018-06-17 DIAGNOSIS — L659 Nonscarring hair loss, unspecified: Secondary | ICD-10-CM | POA: Insufficient documentation

## 2018-06-17 MED ORDER — FLUOXETINE HCL 20 MG PO TABS: 20.0000 mg | ORAL_TABLET | Freq: Every day | ORAL | 3 refills | Status: DC

## 2018-06-17 NOTE — Assessment & Plan Note (Signed)
Depression has worsened.  This seems to be related to life circumstances as outlined above.  She denies SI.  She has responded well to Prozac in the past and we will prescribe this.  Discussed return precautions.  Advised to go to the emergency room if she develops SI.
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
## 2018-06-17 NOTE — Telephone Encounter (Signed)
Sent to pharmacy 

## 2018-06-17 NOTE — Telephone Encounter (Signed)
Copied from CRM [id number]. Topic: Quick Communication - See Telephone Encounter >> Jun 17, 2018  3:11 PM Trula Slade wrote: CRM for notification. See Telephone encounter for: 06/17/18. Please send patient's FLUoxetine (PROZAC) 20 MG tablet medication to [facility] on [address] for this prescription only.

## 2018-06-17 NOTE — Assessment & Plan Note (Signed)
This could be related to stress though could also be related to thyroid dysfunction.  We will check lab work.  Will treat depression.  If not related to thyroid dysfunction we could consider treating with biotin.  If not improving consider dermatology referral.

## 2018-06-17 NOTE — Patient Instructions (Signed)
Nice to see you. We will check labs today and call you with the results.  We will start you on prozac. If your depression worsens please contact us. If you develop thoughts of hurting your self or killing yourself please seek medical attention in the ED.

## 2018-06-17 NOTE — Telephone Encounter (Signed)
Sent to PCP as an FYI pt has no insurance this pharmacy will be the best cost for her.   Sent to PCP

## 2018-06-17 NOTE — Telephone Encounter (Signed)
Pt was seen today by PCP.

## 2018-06-18 ENCOUNTER — Other Ambulatory Visit: Payer: Self-pay | Admitting: Family Medicine

## 2018-06-18 LAB — COMPREHENSIVE METABOLIC PANEL
ALT: 30 U/L (ref 0–35)
AST: 42 U/L — AB (ref 0–37)
Albumin: 4.6 g/dL (ref 3.5–5.2)
Alkaline Phosphatase: 63 U/L (ref 39–117)
BILIRUBIN TOTAL: 0.3 mg/dL (ref 0.2–1.2)
BUN: 7 mg/dL (ref 6–23)
CHLORIDE: 101 meq/L (ref 96–112)
CO2: 27 meq/L (ref 19–32)
Calcium: 9.5 mg/dL (ref 8.4–10.5)
Creatinine, Ser: 0.78 mg/dL (ref 0.40–1.20)
GFR: 86.18 mL/min (ref >60.00)
GLUCOSE: 79 mg/dL (ref 70–99)
POTASSIUM: 3.9 meq/L (ref 3.5–5.1)
Sodium: 137 mEq/L (ref 135–145)
Total Protein: 7.6 g/dL (ref 6.0–8.3)

## 2018-06-18 LAB — TSH: TSH: 1.2 u[IU]/mL (ref 0.35–4.50)

## 2018-06-18 MED ORDER — FLUOXETINE HCL 20 MG PO TABS: 20.0000 mg | ORAL_TABLET | Freq: Every day | ORAL | 3 refills | Status: DC

## 2018-06-18 MED ORDER — FLUOXETINE HCL 20 MG PO CAPS: 20.0000 mg | ORAL_CAPSULE | Freq: Every day | ORAL | 3 refills | Status: DC

## 2018-06-18 NOTE — Telephone Encounter (Signed)
Change has been made.  

## 2018-06-18 NOTE — Telephone Encounter (Signed)
Robin at [facility] called back to ask if a new Rx can be sent in for FLUoxetine (PROZAC) 20 MG tablet ( request capsules instead of tablets) Tablets are too costly. Please advise [facility] Ph# [phone]

## 2018-06-21 ENCOUNTER — Telehealth: Payer: Self-pay

## 2018-06-21 MED ORDER — FLUOXETINE HCL 20 MG PO CAPS: 20.0000 mg | ORAL_CAPSULE | Freq: Every day | ORAL | 3 refills | Status: DC

## 2018-06-21 NOTE — Telephone Encounter (Signed)
Sent to pharmacy 

## 2018-06-21 NOTE — Telephone Encounter (Signed)
Called pt and left a detailed VM advised pt to call back if they have any issues or concerns.

## 2018-06-21 NOTE — Telephone Encounter (Signed)
Pharmacy sent a fax requesting for [place] to send CAPSULES for the fluoxetine 20 MG so it is more affordable for the patient?  Pt sent a Mychart message as well.   Sent to PCP

## 2018-07-23 ENCOUNTER — Other Ambulatory Visit: Payer: Self-pay

## 2018-07-23 ENCOUNTER — Encounter: Payer: Self-pay | Admitting: Family Medicine

## 2018-07-23 ENCOUNTER — Ambulatory Visit (INDEPENDENT_AMBULATORY_CARE_PROVIDER_SITE_OTHER): Payer: Self-pay | Admitting: Family Medicine

## 2018-07-23 DIAGNOSIS — F419 Anxiety disorder, unspecified: Secondary | ICD-10-CM

## 2018-07-23 DIAGNOSIS — L659 Nonscarring hair loss, unspecified: Secondary | ICD-10-CM

## 2018-07-23 DIAGNOSIS — F329 Major depressive disorder, single episode, unspecified: Secondary | ICD-10-CM

## 2018-07-23 NOTE — Progress Notes (Signed)
Virtual Visit via telephone note  This visit type was conducted due to national recommendations for restrictions regarding the COVID-19 pandemic (e.g. social distancing).  This format is felt to be most appropriate for this patient at this time.  All issues noted in this document were discussed and addressed.  No physical exam was performed (except for noted visual exam findings with Video Visits).   I connected with on 07/24/18 at 11:30 AM EDT by a video enabled telemedicine application or telephone and verified that I am speaking with the correct person using two identifiers. Location patient: home Location provider: work Persons participating in the virtual visit: patient, provider  I discussed the limitations, risks, security and privacy concerns of performing an evaluation and management service by telephone and the availability of in person appointments. I also discussed with the patient that there may be a patient responsible charge related to this service. The patient expressed understanding and agreed to proceed.  Interactive audio and video telecommunications were attempted between this provider and patient, however failed, due to patient having technical difficulties OR patient did not have access to video capability.  We continued and completed visit with audio only.  Reason for visit: Follow-up  HPI: Anxiety/depression: Patient notes this is no better though it is not any worse.  Taking Prozac.  No SI.  She is gained 15 pounds.  She notes significantly abnormal dreams at night since starting on the Prozac.  She has not taken Zoloft in the past.  She has taken Lexapro though it was not beneficial.  Hair loss: She continues to have issues with this.  Her hair is still falling out.  Her eyebrows have been falling out as well.  Her nails have been breaking as well.  She does note some scabbing in her scalp.   ROS: See pertinent positives and negatives per HPI.  Past Medical History:   Diagnosis Date  . Asthma   . Chickenpox   . Depression   . Generalized anxiety disorder     Past Surgical History:  Procedure Laterality Date  . BREAST REDUCTION SURGERY      Family History  Problem Relation Age of Onset  . Arthritis Mother   . Stroke Mother   . Depression Mother   . Alcohol abuse Maternal Grandfather   . Lung cancer Maternal Grandfather     SOCIAL HX: Smoker.   Current Outpatient Medications:  .  FLUoxetine (PROZAC) 20 MG capsule, Take 1 capsule (20 mg total) by mouth daily., Disp: 90 capsule, Rfl: 3  EXAM: This was a telehealth telephone visit and thus no physical exam was completed  ASSESSMENT AND PLAN:  Discussed the following assessment and plan:  Anxiety and depression  Hair loss - Plan: Ambulatory referral to Dermatology  Anxiety and depression Unchanged.  She is having side effects from the Prozac.  We will transition her to Zoloft.  She will discontinue the Prozac and 7 days later started on Zoloft.  We will have her do a phone follow-up in 6 weeks.  Hair loss This continues to be an issue.  I discussed that she would need to see dermatology for further evaluation.  I will place a referral.    I discussed the assessment and treatment plan with the patient. The patient was provided an opportunity to ask questions and all were answered. The patient agreed with the plan and demonstrated an understanding of the instructions.   The patient was advised to call back or seek an in-person  evaluation if the symptoms worsen or if the condition fails to improve as anticipated.  I provided 18 minutes of non-face-to-face time during this encounter.   Marikay Alar, MD

## 2018-07-24 ENCOUNTER — Telehealth: Payer: Self-pay | Admitting: Family Medicine

## 2018-07-24 MED ORDER — SERTRALINE HCL 100 MG PO TABS: ORAL_TABLET | ORAL | 2 refills | Status: DC

## 2018-07-24 NOTE — Assessment & Plan Note (Signed)
This continues to be an issue.  I discussed that she would need to see dermatology for further evaluation.  I will place a referral.

## 2018-07-24 NOTE — Assessment & Plan Note (Signed)
Unchanged.  She is having side effects from the Prozac.  We will transition her to Zoloft.  She will discontinue the Prozac and 7 days later started on Zoloft.  We will have her do a phone follow-up in 6 weeks.

## 2018-07-24 NOTE — Telephone Encounter (Signed)
Please call the patient and get her set up for follow-up by phone in 6 weeks.  Thanks.

## 2018-07-26 NOTE — Telephone Encounter (Signed)
Called pt and left a VM to call back. CRM created and sent to [facility] pool. 

## 2018-07-28 NOTE — Telephone Encounter (Signed)
Called and spoke with pt. Pt has been scheduled for an appt.  

## 2018-08-19 ENCOUNTER — Other Ambulatory Visit: Payer: Self-pay | Admitting: Family Medicine

## 2018-08-25 ENCOUNTER — Ambulatory Visit: Payer: Self-pay | Admitting: Family Medicine

## 2019-01-24 ENCOUNTER — Ambulatory Visit: Payer: Self-pay

## 2019-01-24 NOTE — Telephone Encounter (Signed)
Patient called stating that she has had anxiety issues for at least 4 years.  She has been an alcoholic for that long as well.  She has recently entered the court system with a DWI charge. She is signed up for Detox to start Wednesday .  She will wear a BAC.  She states that she is not suisidal or homicidal. She states she has a great support system. She has had 5 sessions with a Veterinary surgeon. She fears detox because of seizures.  She states this has been a great wake-up and wishes to change. She states that she would like an appointment to discuss issues with Dr Birdie Sons. Care advice was read to patient.  She verbalized understanding. Will route note to office for scheduling in AM. Patient is aware and agrees with plan.  Reason for Disposition . Symptoms interfere with work or school  Answer Assessment - Initial Assessment Questions 1. CONCERN: "What happened that made you call today?"     Going [place] tox with advice of lawyer DWI 2. ANXIETY SYMPTOM SCREENING: "Can you describe how you have been feeling?"  (e.g., tense, restless, panicky, anxious, keyed up, trouble sleeping, trouble concentrating)     anxious 3. ONSET: "How long have you been feeling this way?"     4 years 4. RECURRENT: "Have you felt this way before?"  If yes: "What happened that time?" "What helped these feelings go away in the past?"      Yes and has been on medication 5. RISK OF HARM - SUICIDAL IDEATION:  "Do you ever have thoughts of hurting or killing yourself?"  (e.g., yes, no, no but preoccupation with thoughts about death)   - INTENT:  "Do you have thoughts of hurting or killing yourself right NOW?" (e.g., yes, no, N/A)   - PLAN: "Do you have a specific plan for how you would do this?" (e.g., gun, knife, overdose, no plan, N/A)     No denies 6. RISK OF HARM - HOMICIDAL IDEATION:  "Do you ever have thoughts of hurting or killing someone else?"  (e.g., yes, no, no but preoccupation with thoughts about death)   - INTENT:  "Do  you have thoughts of hurting or killing someone right NOW?" (e.g., yes, no, N/A)   - PLAN: "Do you have a specific plan for how you would do this?" (e.g., gun, knife, no plan, N/A)     No 7. FUNCTIONAL IMPAIRMENT: "How have things been going for you overall in your life? Have you had any more difficulties than usual doing your normal daily activities?"  (e.g., better, same, worse; self-care, school, work, interactions)    DWI recently planning detox worried about seizures 8. SUPPORT: "Who is with you now?" "Who do you live with?" "Do you have family or friends nearby who you can talk to?"      Yes very great support group 9. THERAPIST: "Do you have a counselor or therapist? Name?"    5 sesions  10. STRESSORS: "Has there been any new stress or recent changes in your life?"       DWI 11. CAFFEINE ABUSE: "Do you drink caffeinated beverages, and how much each day?" (e.g., coffee, tea, colas)       no 12. SUBSTANCE ABUSE: "Do you use any illegal drugs or alcohol?"       alcohol 13. OTHER SYMPTOMS: "Do you have any other physical symptoms right now?" (e.g., chest pain, palpitations, difficulty breathing, fever)       no 14. PREGNANCY: "  Is there any chance you are pregnant?" "When was your last menstrual period?"      No last period 1st of month IUD  Protocols used: ANXIETY AND PANIC ATTACK-A-AH

## 2019-01-25 ENCOUNTER — Encounter: Payer: Self-pay | Admitting: Family Medicine

## 2019-01-25 ENCOUNTER — Ambulatory Visit (INDEPENDENT_AMBULATORY_CARE_PROVIDER_SITE_OTHER): Payer: Self-pay | Admitting: Family Medicine

## 2019-01-25 ENCOUNTER — Other Ambulatory Visit: Payer: Self-pay

## 2019-01-25 DIAGNOSIS — F329 Major depressive disorder, single episode, unspecified: Secondary | ICD-10-CM

## 2019-01-25 DIAGNOSIS — F1020 Alcohol dependence, uncomplicated: Secondary | ICD-10-CM | POA: Insufficient documentation

## 2019-01-25 DIAGNOSIS — F419 Anxiety disorder, unspecified: Secondary | ICD-10-CM

## 2019-01-25 NOTE — Telephone Encounter (Signed)
Noted. Plan to complete office visit this afternoon.

## 2019-01-25 NOTE — Assessment & Plan Note (Signed)
Patient continues to have issues with this.  I will confer with our clinical pharmacist regarding an appropriate SSRI for her given her alcohol use and seizures associated with discontinuing alcohol use.

## 2019-01-25 NOTE — Assessment & Plan Note (Addendum)
Had a long discussion with the patient regarding her alcohol use.  I discussed that she needs to see somebody trained in alcohol detox to help with her detox.  I advised her that she should not suddenly discontinue drinking as there is a risk of withdrawal and seizures and death related to sudden discontinuation of alcohol use.  She noted that the court had mandated a time of noon tomorrow that she stop drinking and I advised her that this would not change my recommendation that she should not stop drinking all of a sudden and should undergo evaluation with a detox specialist if she is going to stop drinking.  I provided her with a number of 2 locations that she can call for assistance in detox.  I advised her to contact them as soon as she left our office.  Given her seizures related to alcohol withdrawal previously I advised that she not drive.  I offered to write a letter to her lawyer outlining my medical recommendations though she declined this.  I encouraged her to contact the [facility] or residential treatment service of [place] to seek detox services.

## 2019-01-25 NOTE — Progress Notes (Signed)
Tommi Rumps, MD Phone: [phone]  Sherry Salas is a 33 y.o. female who presents today for same-day visit.  Anxiety/depression/alcohol abuse: Patient notes she got a DWI recently.  She has been drinking brandy for breakfast and then also drinks vodka and wine later in the day.  She reports typically drinking about a half a bottle of the vodka and wine a day.  She lost her job related to this.  She reports that she has been involved with the court system regarding this and they gave her several choices including going to an inpatient detox center, wearing an ankle bracelet that provides a BAC and detoxing as an outpatient, versus going to jail.  She could not afford the inpatient detox center and opted for the ankle bracelet.  She has not met with a physician that is trained in detox medication management.  She notes her lawyer recommended she see me.  She is terrified about what could happen if she is to detox.  She reports she quit drinking on a couple of occasions over the last couple of months and reports she had a seizure each time.  She has not had any seizures while drinking.  She does not currently have any insurance.  She does note anxiety and depression.  She ran out of her Zoloft as she did not have insurance.  She denies SI.  Social History   Tobacco Use  Smoking Status Current Every Day Smoker  . Packs/day: 0.50  . Types: Cigarettes  Smokeless Tobacco Never Used     ROS see history of present illness  Objective  Physical Exam Vitals:   01/25/19 1455  BP: 120/80  Pulse: 77  Temp: 97.7 F (36.5 C)  SpO2: 98%    BP Readings from Last 3 Encounters:  01/25/19 120/80  06/17/18 130/70  06/01/17 128/70   Wt Readings from Last 3 Encounters:  01/25/19 147 lb (66.7 kg)  06/17/18 147 lb 12.8 oz (67 kg)  06/01/17 140 lb 12.8 oz (63.9 kg)    Physical Exam Constitutional:      General: She is not in acute distress. Pulmonary:     Effort: Pulmonary effort is normal.   Neurological:     Mental Status: She is alert.  Psychiatric:     Comments: Mood anxious and depressed, affect anxious, tearful      Assessment/Plan: Please see individual problem list.  Anxiety and depression Patient continues to have issues with this.  I will confer with our clinical pharmacist regarding an appropriate SSRI for her given her alcohol use and seizures associated with discontinuing alcohol use.  Alcohol use disorder, severe, dependence ([place]) Had a long discussion with the patient regarding her alcohol use.  I discussed that she needs to see somebody trained in alcohol detox to help with her detox.  I advised her that she should not suddenly discontinue drinking as there is a risk of withdrawal and seizures and death related to sudden discontinuation of alcohol use.  She noted that the court had mandated a time of noon tomorrow that she stop drinking and I advised her that this would not change my recommendation that she should not stop drinking all of a sudden and should undergo evaluation with a detox specialist if she is going to stop drinking.  I provided her with a number of 2 locations that she can call for assistance in detox.  I advised her to contact them as soon as she left our office.  Given her seizures  related to alcohol withdrawal previously I advised that she not drive.  I offered to write a letter to her lawyer outlining my medical recommendations though she declined this.  I encouraged her to contact the [facility] or residential treatment service of [place] to seek detox services.   No orders of the defined types were placed in this encounter.   No orders of the defined types were placed in this encounter.    Tommi Rumps, MD [place]

## 2019-01-25 NOTE — Patient Instructions (Signed)
Nice to see you. Please call one of the following numbers for detox.  Residential treatment service of [place] at [phone]. [facility] in [place] at [phone]. Please call them as soon as you leave today.  I would not recommend stopping drinking suddenly. There is risk of withdrawal with seizures and death. You need to be appropriately detox'd to avoid this.  You should not drive given that you have had seizures related to stopping alcohol intake.

## 2019-01-25 NOTE — Telephone Encounter (Signed)
Scheduled for 1445 today.

## 2019-01-26 ENCOUNTER — Telehealth: Payer: Self-pay | Admitting: Family Medicine

## 2019-01-26 NOTE — Telephone Encounter (Signed)
Please call the patient and see if she was able to contact either of the detox locations that I gave her yesterday. Please see if she was able to get in for evaluation and treatment. If she was not able to do this please see if she is having any alcohol withdrawal symptoms such as palpitations, sweating, headache, stomach upset, hallucinations, tremor, or any seizure issues. If she is having any withdrawal symptoms I would suggest that she go to the ED for evaluation. Thanks.

## 2019-01-26 NOTE — Telephone Encounter (Signed)
I Called pt and left a vm for pt to call ofc to schedule Return in about 1 month (around 02/25/2019).

## 2019-01-26 NOTE — Telephone Encounter (Signed)
I called the patient and left a voicemail for the patient to cal and ask for nina.  Nina,cma

## 2019-01-26 NOTE — Telephone Encounter (Signed)
I called and spoke with the patient and asked her if she had reached out to any of the detox locations per the provider, she stated that she did speak to one of them but she has been in [place] all day getting her ankle bracelet put on.  I informed her that if she was having any alcohol withdrawal symptoms she needs to go to the ER, patient stated she understood.  Kurt Azimi,cma

## 2019-01-31 ENCOUNTER — Ambulatory Visit: Payer: Self-pay | Admitting: Pharmacist

## 2019-01-31 DIAGNOSIS — F32A Depression, unspecified: Secondary | ICD-10-CM

## 2019-01-31 DIAGNOSIS — F329 Major depressive disorder, single episode, unspecified: Secondary | ICD-10-CM

## 2019-01-31 NOTE — Chronic Care Management (AMB) (Signed)
  Chronic Care Management   Note  01/31/2019 Name: Sherry Salas MRN: [id number] DOB: 10-Aug-1985  Sherry Salas is a 33 y.o. year old female who is a primary care patient of Caryl Bis, Angela Adam, MD. The CCM team was consulted for assistance with chronic disease management and care coordination needs.    Received referral for medication access needs. Attempted to contact patient, left HIPAA compliant message for her to return my call at her convenience.   Follow up plan: - If I do not hear back, will outreach patient again in the next 1-2 weeks.   Catie Darnelle Maffucci, PharmD Clinical Pharmacist [place] [phone]

## 2019-01-31 NOTE — Progress Notes (Signed)
Reviewed.  Agree with continuing attempt to reach pt.    Dr Nicki Reaper

## 2019-02-07 ENCOUNTER — Ambulatory Visit: Payer: Self-pay | Admitting: Pharmacist

## 2019-02-07 DIAGNOSIS — F329 Major depressive disorder, single episode, unspecified: Secondary | ICD-10-CM

## 2019-02-07 DIAGNOSIS — F419 Anxiety disorder, unspecified: Secondary | ICD-10-CM

## 2019-02-07 NOTE — Progress Notes (Signed)
Reviewed.  Agree with plan   Dr Natiya Seelinger 

## 2019-02-07 NOTE — Chronic Care Management (AMB) (Signed)
  Chronic Care Management   Note  02/07/2019 Name: Sherry Salas MRN: [id number] DOB: 07/29/1985  Sherry Salas is a 33 y.o. year old female who is a primary care patient of Caryl Bis, Angela Adam, MD. The CCM team was consulted for assistance with chronic disease management and care coordination needs.    Attempted to contact patient to follow up on medication access needs. Left HIPAA compliant message for patient to return my call at her convenience.   Follow up plan: - If I do not hear back, will outreach again in the next 2-3 weeks  Catie Darnelle Maffucci, PharmD, [place] Pharmacist [place] [phone]

## 2019-02-10 ENCOUNTER — Telehealth: Payer: Self-pay

## 2019-06-09 ENCOUNTER — Encounter: Payer: Self-pay | Admitting: Emergency Medicine

## 2019-06-09 ENCOUNTER — Other Ambulatory Visit: Payer: Self-pay

## 2019-06-09 ENCOUNTER — Emergency Department: Payer: Self-pay

## 2019-06-09 ENCOUNTER — Emergency Department
Admission: EM | Admit: 2019-06-09 | Discharge: 2019-06-09 | Disposition: A | Discharge status: Home / Self Care | Payer: Self-pay | Attending: Emergency Medicine | Admitting: Emergency Medicine

## 2019-06-09 ENCOUNTER — Emergency Department (HOSPITAL_COMMUNITY): Payer: Self-pay

## 2019-06-09 DIAGNOSIS — F17210 Nicotine dependence, cigarettes, uncomplicated: Secondary | ICD-10-CM | POA: Insufficient documentation

## 2019-06-09 DIAGNOSIS — B9689 Other specified bacterial agents as the cause of diseases classified elsewhere: Secondary | ICD-10-CM

## 2019-06-09 DIAGNOSIS — J45909 Unspecified asthma, uncomplicated: Secondary | ICD-10-CM | POA: Insufficient documentation

## 2019-06-09 DIAGNOSIS — R1030 Lower abdominal pain, unspecified: Secondary | ICD-10-CM

## 2019-06-09 DIAGNOSIS — N760 Acute vaginitis: Secondary | ICD-10-CM | POA: Insufficient documentation

## 2019-06-09 DIAGNOSIS — Z975 Presence of (intrauterine) contraceptive device: Secondary | ICD-10-CM | POA: Insufficient documentation

## 2019-06-09 LAB — URINALYSIS, COMPLETE (UACMP) WITH MICROSCOPIC
Bacteria, UA: NONE SEEN
Bilirubin Urine: NEGATIVE
Glucose, UA: NEGATIVE mg/dL
Hgb urine dipstick: NEGATIVE
Ketones, ur: NEGATIVE mg/dL
Nitrite: NEGATIVE
Protein, ur: NEGATIVE mg/dL
Specific Gravity, Urine: 1.016 (ref 1.005–1.030)
pH: 5 (ref 5.0–8.0)

## 2019-06-09 LAB — CHLAMYDIA/NGC RT PCR (ARMC ONLY)
Chlamydia Tr: NOT DETECTED
N gonorrhoeae: NOT DETECTED

## 2019-06-09 LAB — WET PREP, GENITAL
Sperm: NONE SEEN
Trich, Wet Prep: NONE SEEN
Yeast Wet Prep HPF POC: NONE SEEN

## 2019-06-09 LAB — PREGNANCY, URINE: Preg Test, Ur: NEGATIVE

## 2019-06-09 MED ORDER — METRONIDAZOLE 500 MG PO TABS: 500.0000 mg | ORAL_TABLET | Freq: Two times a day (BID) | ORAL | 0 refills | Status: AC

## 2019-06-09 NOTE — ED Provider Notes (Signed)
[facility] Emergency Department Provider Note  Time seen: 7:43 AM  I have reviewed the triage vital signs and the nursing notes.   HISTORY  Chief Complaint Abdominal Cramping   HPI Sherry Salas is a 34 y.o. female with a past medical history of asthma, anxiety, presents to the emergency department for lower abdominal cramping.  According to the patient her last menstrual cycle ended approximately 2 weeks ago.  She states since that time she has continued to have very light spotting and has been experiencing intermittent cramping in lower abdomen denies any currently.  Denies any abdominal pain.  Patient has an IUD that was placed 12 years ago.  Also states a new boyfriend over the past 6 months that has had increased occurrence of intercourse per patient but denies any discharge.  Patient's main concern is that her IUD has become displaced.  She states shortly after the IUD was placed she had the strings cut off due to discomfort, so she has no way to know if it is still in place or not.  Patient denies any dysuria, hematuria, nausea vomiting or diarrhea.  No fever.  Largely negative review of systems otherwise.   Past Medical History:  Diagnosis Date  . Asthma   . Chickenpox   . Depression   . Generalized anxiety disorder     Patient Active Problem List   Diagnosis Date Noted  . Alcohol use disorder, severe, dependence (HCC) 01/25/2019  . Hair loss 06/17/2018  . Palpitations 09/21/2015  . Anxiety and depression 03/20/2015    Past Surgical History:  Procedure Laterality Date  . BREAST REDUCTION SURGERY      Prior to Admission medications   Not on File    Allergies  Allergen Reactions  . Sulfa Antibiotics     Family History  Problem Relation Age of Onset  . Arthritis Mother   . Stroke Mother   . Depression Mother   . Alcohol abuse Maternal Grandfather   . Lung cancer Maternal Grandfather     Social History Social History   Tobacco Use   . Smoking status: Current Every Day Smoker    Packs/day: 0.50    Types: Cigarettes  . Smokeless tobacco: Never Used  Substance Use Topics  . Alcohol use: Yes    Alcohol/week: 1.0 standard drinks    Types: 1 Glasses of wine per week  . Drug use: Not on file    Review of Systems Constitutional: Negative for fever. Cardiovascular: Negative for chest pain. Respiratory: Negative for shortness of breath. Gastrointestinal: Intermittent mild lower abdominal cramping. Genitourinary: Negative for urinary compaints.  Occasional spotting. Musculoskeletal: Negative for musculoskeletal complaints Neurological: Negative for headache All other ROS negative  ____________________________________________   PHYSICAL EXAM:  VITAL SIGNS: ED Triage Vitals  Enc Vitals Group     BP 06/09/19 0655 (!) 139/97     Pulse Rate 06/09/19 0655 89     Resp 06/09/19 0655 20     Temp 06/09/19 0655 98 F (36.7 C)     Temp Source 06/09/19 0655 Oral     SpO2 06/09/19 0655 100 %     Weight 06/09/19 0656 135 lb (61.2 kg)     Height 06/09/19 0656 5\' 7"  (1.702 m)     Head Circumference --      Peak Flow --      Pain Score 06/09/19 0656 4     Pain Loc --      Pain Edu? --  Excl. in [place]? --    Constitutional: Alert and oriented. Well appearing and in no distress. Eyes: Normal exam ENT      Head: Normocephalic and atraumatic.      Mouth/Throat: Mucous membranes are moist. Cardiovascular: Normal rate, regular rhythm.  Respiratory: Normal respiratory effort without tachypnea nor retractions. Breath sounds are clear Gastrointestinal: Soft and nontender. No distention.  Musculoskeletal: Nontender with normal range of motion in all extremities. Neurologic:  Normal speech and language. No gross focal neurologic deficits Skin:  Skin is warm, dry and intact.  Psychiatric: Mood and affect are normal.   ____________________________________________    RADIOLOGY  No acute  findings.  ____________________________________________   INITIAL IMPRESSION / ASSESSMENT AND PLAN / ED COURSE  Pertinent labs & imaging results that were available during my care of the patient were reviewed by me and considered in my medical decision making (see chart for details).   Patient presents to the emergency department for lower abdominal cramping and vaginal spotting.  Patient's main concern is that her IUD is possibly dislodged.  Patient denies any discharge.  No dysuria.  Patient has benign abdominal exam without any tenderness.  We will check a urine sample, pregnancy test, we will perform a pelvic exam and wet prep and likely an ultrasound.  Patient agreeable to plan of care.  Ultrasound shows IUD is in appropriate place.  Patient will follow up with her OB to discuss removal/exchange.  Currently the patient appears well, wet prep positive for BV we will treat with Flagyl.  STD test is pending although the patient does not believe she is at risk.  Sherry Salas was evaluated in Emergency Department on 06/09/2019 for the symptoms described in the history of present illness. She was evaluated in the context of the global COVID-19 pandemic, which necessitated consideration that the patient might be at risk for infection with the SARS-CoV-2 virus that causes COVID-19. Institutional protocols and algorithms that pertain to the evaluation of patients at risk for COVID-19 are in a state of rapid change based on information released by regulatory bodies including the CDC and federal and state organizations. These policies and algorithms were followed during the patient's care in the ED.  ____________________________________________   FINAL CLINICAL IMPRESSION(S) / ED DIAGNOSES  Lower abdominal cramping   Harvest Dark, MD 06/09/19 [id number]

## 2019-06-09 NOTE — ED Triage Notes (Signed)
Pt states she has had lower abd cramping that started 2 weeks ago, states thinks may have displaced her IUD>

## 2019-06-09 NOTE — ED Notes (Signed)
EDP Paduchowski at bedside.  

## 2019-10-08 ENCOUNTER — Encounter: Payer: Self-pay | Admitting: Intensive Care

## 2019-10-08 ENCOUNTER — Emergency Department: Payer: Medicaid Other

## 2019-10-08 ENCOUNTER — Other Ambulatory Visit: Payer: Self-pay

## 2019-10-08 DIAGNOSIS — K852 Alcohol induced acute pancreatitis without necrosis or infection: Secondary | ICD-10-CM | POA: Insufficient documentation

## 2019-10-08 DIAGNOSIS — J45909 Unspecified asthma, uncomplicated: Secondary | ICD-10-CM | POA: Insufficient documentation

## 2019-10-08 DIAGNOSIS — F17210 Nicotine dependence, cigarettes, uncomplicated: Secondary | ICD-10-CM | POA: Insufficient documentation

## 2019-10-08 LAB — COMPREHENSIVE METABOLIC PANEL
ALT: 75 U/L — ABNORMAL HIGH (ref 0–44)
AST: 99 U/L — ABNORMAL HIGH (ref 15–41)
Albumin: 4.3 g/dL (ref 3.5–5.0)
Alkaline Phosphatase: 106 U/L (ref 38–126)
Anion gap: 12 (ref 5–15)
BUN: <5 mg/dL — ABNORMAL LOW (ref 6–20)
CO2: 25 mmol/L (ref 22–32)
Calcium: 9.6 mg/dL (ref 8.9–10.3)
Chloride: 97 mmol/L — ABNORMAL LOW (ref 98–111)
Creatinine, Ser: 0.56 mg/dL (ref 0.44–1.00)
GFR calc Af Amer: >60 mL/min (ref >60)
GFR calc non Af Amer: >60 mL/min (ref >60)
Glucose, Bld: 95 mg/dL (ref 70–99)
Potassium: 3.7 mmol/L (ref 3.5–5.1)
Sodium: 134 mmol/L — ABNORMAL LOW (ref 135–145)
Total Bilirubin: 0.6 mg/dL (ref 0.3–1.2)
Total Protein: 8 g/dL (ref 6.5–8.1)

## 2019-10-08 LAB — URINALYSIS, COMPLETE (UACMP) WITH MICROSCOPIC
Bacteria, UA: NONE SEEN
Bilirubin Urine: NEGATIVE
Glucose, UA: NEGATIVE mg/dL
Hgb urine dipstick: NEGATIVE
Ketones, ur: NEGATIVE mg/dL
Nitrite: NEGATIVE
Protein, ur: NEGATIVE mg/dL
Specific Gravity, Urine: 1.01 (ref 1.005–1.030)
pH: 7 (ref 5.0–8.0)

## 2019-10-08 LAB — CBC
HCT: 42.2 % (ref 36.0–46.0)
Hemoglobin: 14.3 g/dL (ref 12.0–15.0)
MCH: 34.6 pg — ABNORMAL HIGH (ref 26.0–34.0)
MCHC: 33.9 g/dL (ref 30.0–36.0)
MCV: 102.2 fL — ABNORMAL HIGH (ref 80.0–100.0)
Platelets: 239 10*3/uL (ref 150–400)
RBC: 4.13 MIL/uL (ref 3.87–5.11)
RDW: 13.6 % (ref 11.5–15.5)
WBC: 8.2 10*3/uL (ref 4.0–10.5)
nRBC: 0 % (ref 0.0–0.2)

## 2019-10-08 LAB — LIPASE, BLOOD: Lipase: 166 U/L — ABNORMAL HIGH (ref 11–51)

## 2019-10-08 LAB — PREGNANCY, URINE: Preg Test, Ur: NEGATIVE

## 2019-10-08 IMAGING — CT CT ABD-PELV W/ CM
2 of 4 series · 16 of 46 positions shown, 18 images · IV contrast (APPLIED)
Comparison: None.

CLINICAL DATA: Right upper quadrant pain.

EXAM:
CT ABDOMEN AND PELVIS WITH CONTRAST
TECHNIQUE: Multidetector CT imaging of the abdomen and pelvis was performed
using the standard protocol following bolus administration of
intravenous contrast.
CONTRAST:  100mL OMNIPAQUE IOHEXOL 300 MG/ML  SOLN

[Series 2: routine abd/pel with · axial · 0.73mm/px · z∈[-1097,-717]mm · 13 of 84 slices shown, 15 images]
[im 4/84  soft-tissue]
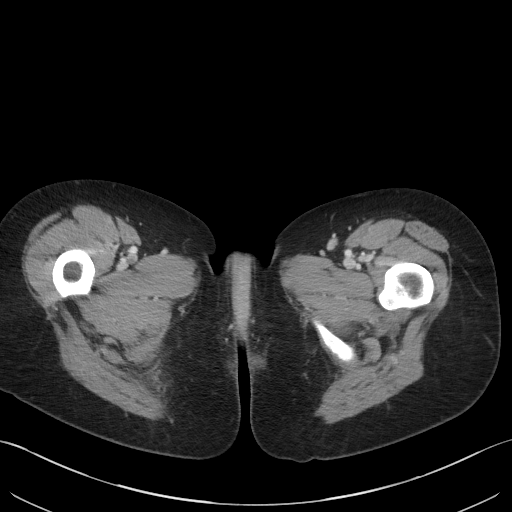
[im 4/84  bone]
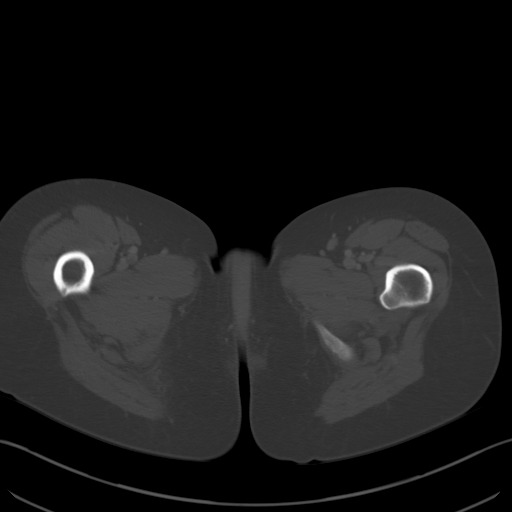
[im 11/84  soft-tissue]
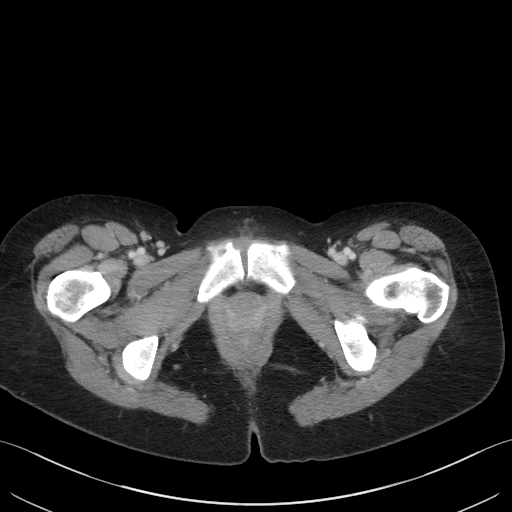
[im 18/84  soft-tissue]
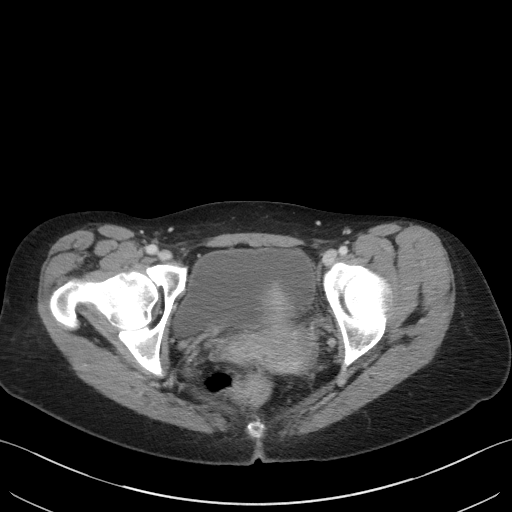
[im 25/84  soft-tissue]
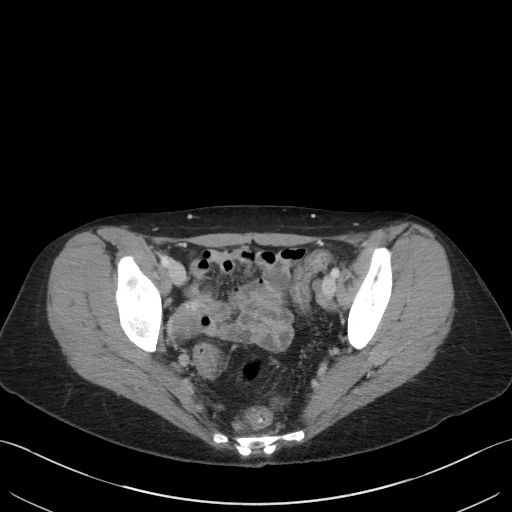
[im 28/84  soft-tissue]
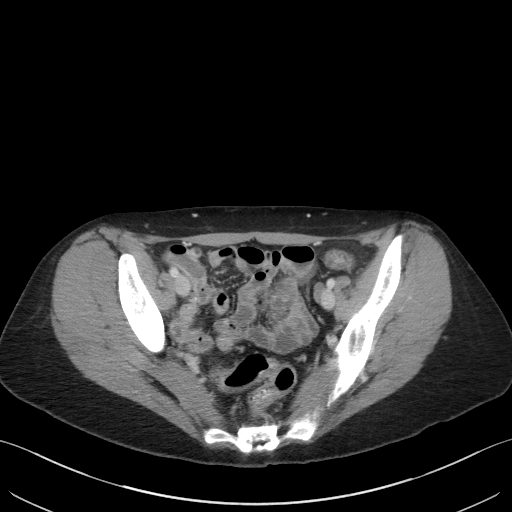
[im 35/84  soft-tissue]
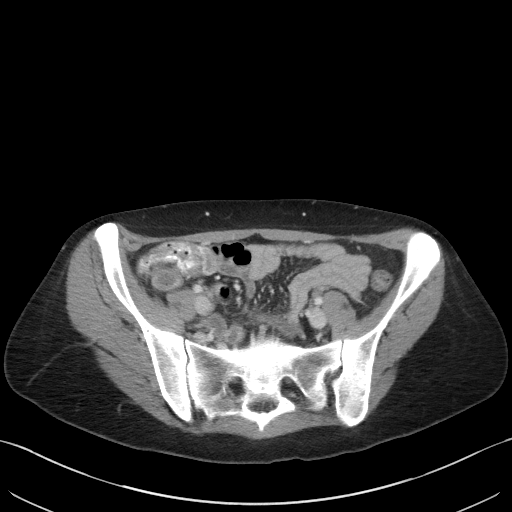
[im 42/84  soft-tissue]
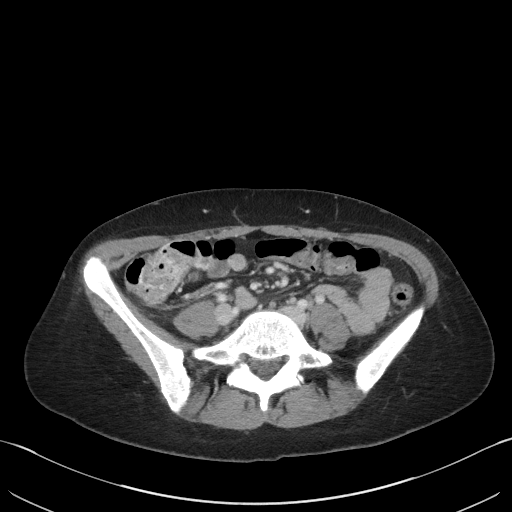
[im 49/84  soft-tissue]
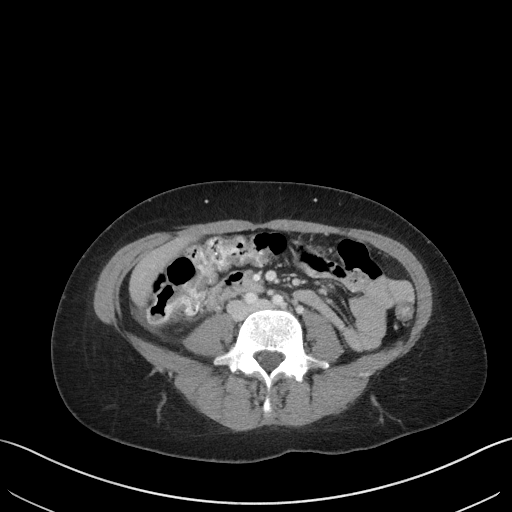
[im 56/84  soft-tissue]
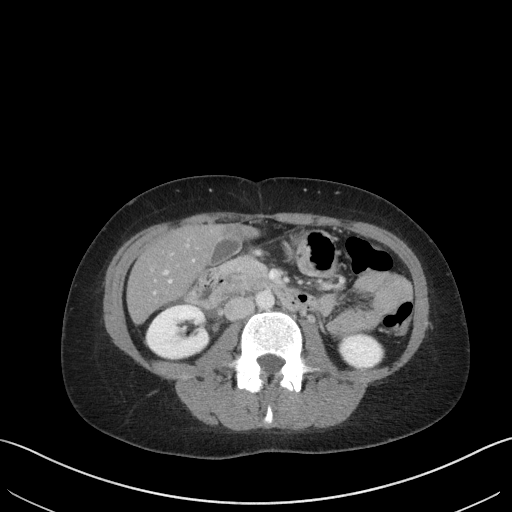
[im 56/84  bone]
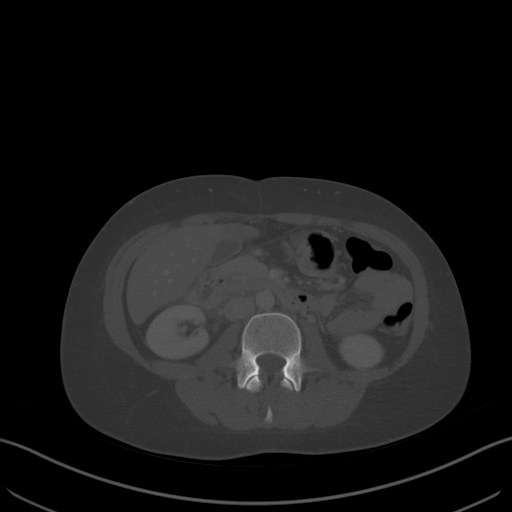
[im 59/84  soft-tissue]
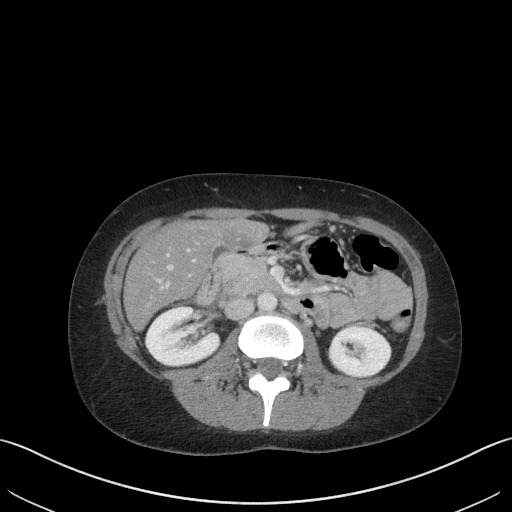
[im 66/84  soft-tissue]
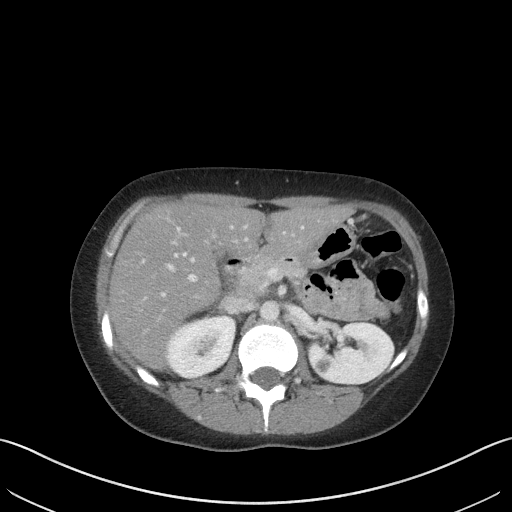
[im 73/84  soft-tissue]
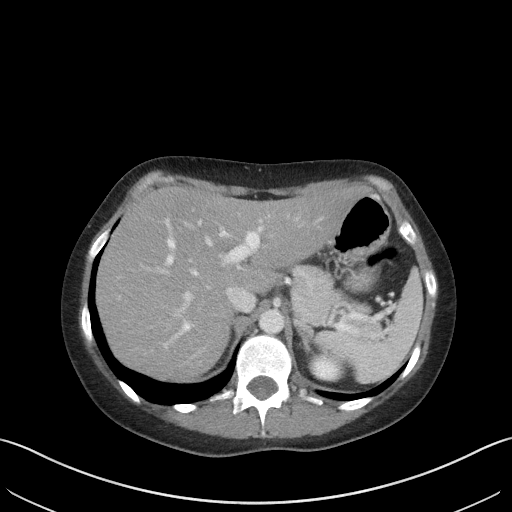
[im 80/84  soft-tissue]
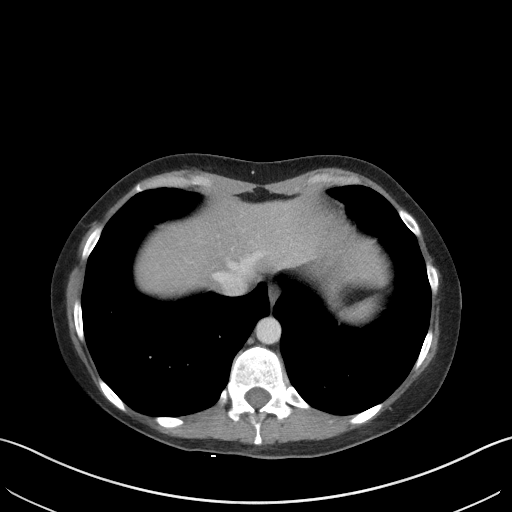

[Series 5: coronal st · coronal · 0.64mm/px · 3 of 71 slices shown]
[im 24/71  soft-tissue]
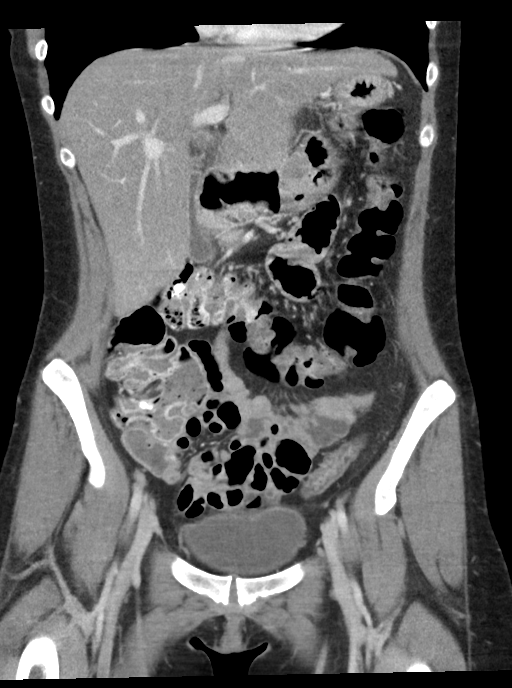
[im 32/71  soft-tissue]
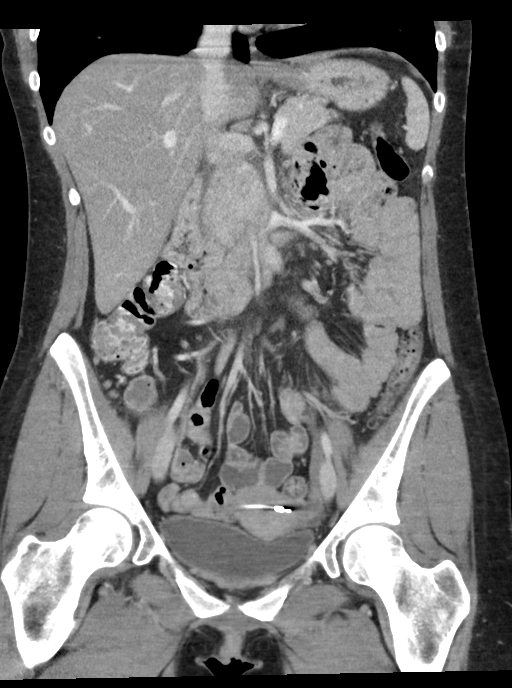
[im 39/71  soft-tissue]
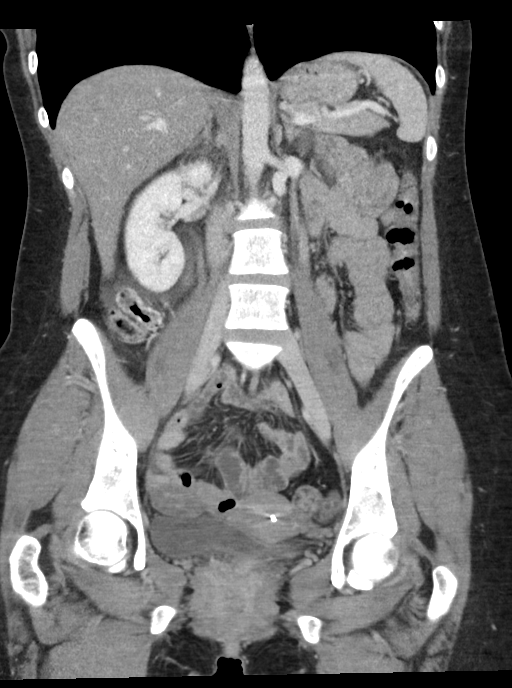

[16 of 46 positions shown; findings below may reference images not displayed]

FINDINGS: Lower chest: No acute abnormality.

Hepatobiliary: There is diffuse fatty infiltration of the liver
parenchyma. No focal liver abnormality is seen. No gallstones,
gallbladder wall thickening, or biliary dilatation.

Pancreas: Very mild peripancreatic inflammatory fat stranding is
seen the adjacent to the head of the pancreas.

Spleen: Normal in size without focal abnormality.

Adrenals/Urinary Tract: Adrenal glands are unremarkable. Kidneys are
normal in size, without renal calculi or hydronephrosis. A 1.2 cm
simple cyst is seen within the medial aspect of the mid left kidney.
Bladder is unremarkable.

Stomach/Bowel: Stomach is within normal limits. Appendix appears
normal. No evidence of bowel wall thickening, distention, or
inflammatory changes.

Vascular/Lymphatic: No significant vascular findings are present. No
enlarged abdominal or pelvic lymph nodes.

Reproductive: An IUD is in place. The uterus is otherwise normal in
appearance. A 1.6 cm cyst is seen along the right adnexa.

Other: No abdominal wall hernia or abnormality. No abdominopelvic
ascites.

Musculoskeletal: No acute or significant osseous findings.
IMPRESSION: 1. Very mild acute pancreatitis. Correlation with pancreatic enzymes
is recommended.
2. Hepatic steatosis.
3. Small right adnexal cyst, likely ovarian in origin.

## 2019-10-08 MED ORDER — IOHEXOL 300 MG/ML  SOLN
100.0000 mL | Freq: Once | INTRAMUSCULAR | Status: AC | PRN
  Administered 2019-10-08: 100 mL via INTRAVENOUS

## 2019-10-08 MED ORDER — LORAZEPAM 2 MG/ML IJ SOLN
1.0000 mg | Freq: Once | INTRAMUSCULAR | Status: AC
  Administered 2019-10-08: 1 mg via INTRAVENOUS
  Filled 2019-10-08: qty 1

## 2019-10-08 NOTE — ED Notes (Signed)
Lab notified to add on urine preg to urine in the lab.

## 2019-10-08 NOTE — ED Notes (Signed)
CAlled to subwait by tech, pt states she is very anxious and wants to just take out the IV and leave.  PT told that this RN was concerned about her bloodwork and felt she really needed to stay for the CT scan.  PT verbalizes understanding, but is obviously extremely anxious.  Discussed same with  Dr. Scotty Court, new orders received, see MAR.

## 2019-10-08 NOTE — ED Notes (Signed)
Pt updated on CT scan. Pt reports Ativan did help with her sxs some and is more calm than she was.

## 2019-10-08 NOTE — ED Triage Notes (Signed)
Patient c/o constant right upper abd pain for a couple of days. Tender to touch. No appetite. Denies N/V/D

## 2019-10-08 NOTE — ED Notes (Signed)
Discussed need for [place] with patient, verbalizes understanding of same.  Pt states pain is better at this time sitting.  States pain is 7/10.  Will continue to monitor pt while in waiting area.

## 2019-10-09 ENCOUNTER — Emergency Department
Admission: EM | Admit: 2019-10-09 | Discharge: 2019-10-09 | Disposition: A | Discharge status: Home / Self Care | Payer: Medicaid Other | Attending: Emergency Medicine | Admitting: Emergency Medicine

## 2019-10-09 DIAGNOSIS — K852 Alcohol induced acute pancreatitis without necrosis or infection: Secondary | ICD-10-CM

## 2019-10-09 MED ORDER — CHLORDIAZEPOXIDE HCL 25 MG PO CAPS: 25.0000 mg | ORAL_CAPSULE | Freq: Three times a day (TID) | ORAL | 0 refills | Status: DC | PRN

## 2019-10-09 MED ORDER — LORAZEPAM 2 MG/ML IJ SOLN
1.0000 mg | Freq: Once | INTRAMUSCULAR | Status: AC
  Administered 2019-10-09: 1 mg via INTRAVENOUS
  Filled 2019-10-09: qty 1

## 2019-10-09 NOTE — ED Notes (Signed)
Reviewed discharge instructions, follow-up care, and prescriptions with patient. Patient verbalized understanding of all information reviewed. Patient stable, with no distress noted at this time.    

## 2019-10-09 NOTE — ED Provider Notes (Signed)
[facility] Emergency Department Provider Note  ____________________________________________   First MD Initiated Contact with Patient 10/09/19 0020     (approximate)  I have reviewed the triage vital signs and the nursing notes.   HISTORY  Chief Complaint Abdominal Pain    HPI Sherry Salas is a 34 y.o. female with below list of previous medical conditions including alcohol abuse anxiety and depression presents to the emergency department secondary to epigastric abdominal pain x2 days with associated nausea however no vomiting.  Patient denies any fever.  Patient does admit to heavy EtOH ingestion daily stating that she drinks 3 vodka drinks and bourbon every day.  Patient does admit to previous alcohol withdrawal with seizure.        Past Medical History:  Diagnosis Date  . Asthma   . Chickenpox   . Depression   . Generalized anxiety disorder     Patient Active Problem List   Diagnosis Date Noted  . Alcohol use disorder, severe, dependence (HCC) 01/25/2019  . Hair loss 06/17/2018  . Palpitations 09/21/2015  . Anxiety and depression 03/20/2015    Past Surgical History:  Procedure Laterality Date  . BREAST REDUCTION SURGERY      Prior to Admission medications   Not on File    Allergies Sulfa antibiotics  Family History  Problem Relation Age of Onset  . Arthritis Mother   . Stroke Mother   . Depression Mother   . Alcohol abuse Maternal Grandfather   . Lung cancer Maternal Grandfather     Social History Social History   Tobacco Use  . Smoking status: Current Every Day Smoker    Packs/day: 0.50    Types: Cigarettes  . Smokeless tobacco: Never Used  Substance Use Topics  . Alcohol use: Yes    Alcohol/week: 10.0 standard drinks    Types: 10 Glasses of wine per week  . Drug use: Never    Review of Systems Constitutional: No fever/chills Eyes: No visual changes. ENT: No sore throat. Cardiovascular: Denies chest  pain. Respiratory: Denies shortness of breath. Gastrointestinal: Positive for abdominal pain and nausea, no vomiting.  No diarrhea.  No constipation. Genitourinary: Negative for dysuria. Musculoskeletal: Negative for neck pain.  Negative for back pain. Integumentary: Negative for rash. Neurological: Negative for headaches, focal weakness or numbness.  ____________________________________________   PHYSICAL EXAM:  VITAL SIGNS: ED Triage Vitals  Enc Vitals Group     BP 10/08/19 1555 (!) 118/104     Pulse Rate 10/08/19 1555 100     Resp 10/08/19 1555 16     Temp 10/08/19 1555 98.6 F (37 C)     Temp Source 10/08/19 1555 Oral     SpO2 10/08/19 1555 100 %     Weight 10/08/19 1552 61.2 kg (135 lb)     Height 10/08/19 1552 1.702 m (5\' 7" )     Head Circumference --      Peak Flow --      Pain Score 10/08/19 1551 7     Pain Loc --      Pain Edu? --      Excl. in GC? --     Constitutional: Alert and oriented.  Tremulous Eyes: Conjunctivae are normal.  Head: Atraumatic. Mouth/Throat: Tongue fasciculations Neck: No stridor.  No meningeal signs.   Cardiovascular: Normal rate, regular rhythm. Good peripheral circulation. Grossly normal heart sounds. Respiratory: Normal respiratory effort.  No retractions. Gastrointestinal: Epigastric tenderness to palpation.  No distention.  Musculoskeletal: No lower extremity  tenderness nor edema. No gross deformities of extremities. Neurologic:  Normal speech and language. No gross focal neurologic deficits are appreciated.  Skin:  Skin is warm, dry and intact. Psychiatric: Mood and affect are normal. Speech and behavior are normal.  ____________________________________________   LABS (all labs ordered are listed, but only abnormal results are displayed)  Labs Reviewed  LIPASE, BLOOD - Abnormal; Notable for the following components:      Result Value   Lipase 166 (*)    All other components within normal limits  COMPREHENSIVE METABOLIC  PANEL - Abnormal; Notable for the following components:   Sodium 134 (*)    Chloride 97 (*)    BUN <5 (*)    AST 99 (*)    ALT 75 (*)    All other components within normal limits  CBC - Abnormal; Notable for the following components:   MCV 102.2 (*)    MCH 34.6 (*)    All other components within normal limits  URINALYSIS, COMPLETE (UACMP) WITH MICROSCOPIC - Abnormal; Notable for the following components:   Color, Urine YELLOW (*)    APPearance HAZY (*)    Leukocytes,Ua SMALL (*)    All other components within normal limits  PREGNANCY, URINE   ____________________________________________  EKG  ED ECG REPORT I, [place] Ayane Delancey, the attending physician, personally viewed and interpreted this ECG.   Date: 10/09/2019  EKG Time: 3:59 PM  Rate: 88  Rhythm: Normal sinus rhythm  Axis: Normal  Intervals: Normal  ST&T Change: None  ____________________________________________  RADIOLOGY I, [place] Carinna Newhart, personally viewed and evaluated these images (plain radiographs) as part of my medical decision making, as well as reviewing the written report by the radiologist.  ED MD interpretation: Mild pancreatitis  Official radiology report(s): CT ABDOMEN PELVIS W CONTRAST  Result Date: 10/08/2019 CLINICAL DATA:  Right upper quadrant pain. EXAM: CT ABDOMEN AND PELVIS WITH CONTRAST TECHNIQUE: Multidetector CT imaging of the abdomen and pelvis was performed using the standard protocol following bolus administration of intravenous contrast. CONTRAST:  OMNIPAQUE IOHEXOL 300 MG/ML  SOLN COMPARISON:  None. FINDINGS: Lower chest: No acute abnormality. Hepatobiliary: There is diffuse fatty infiltration of the liver parenchyma. No focal liver abnormality is seen. No gallstones, gallbladder wall thickening, or biliary dilatation. Pancreas: Very mild peripancreatic inflammatory fat stranding is seen the adjacent to the head of the pancreas. Spleen: Normal in size without focal abnormality.  Adrenals/Urinary Tract: Adrenal glands are unremarkable. Kidneys are normal in size, without renal calculi or hydronephrosis. A 1.2 cm simple cyst is seen within the medial aspect of the mid left kidney. Bladder is unremarkable. Stomach/Bowel: Stomach is within normal limits. Appendix appears normal. No evidence of bowel wall thickening, distention, or inflammatory changes. Vascular/Lymphatic: No significant vascular findings are present. No enlarged abdominal or pelvic lymph nodes. Reproductive: An IUD is in place. The uterus is otherwise normal in appearance. A 1.6 cm cyst is seen along the right adnexa. Other: No abdominal wall hernia or abnormality. No abdominopelvic ascites. Musculoskeletal: No acute or significant osseous findings. IMPRESSION: 1. Very mild acute pancreatitis. Correlation with pancreatic enzymes is recommended. 2. Hepatic steatosis. 3. Small right adnexal cyst, likely ovarian in origin. Electronically Signed   By: Aram Candela M.D.   On: 10/08/2019 21:54   US ABDOMEN LIMITED RUQ  Result Date: 10/08/2019 CLINICAL DATA:  Right upper quadrant abdominal pain EXAM: ULTRASOUND ABDOMEN LIMITED RIGHT UPPER QUADRANT COMPARISON:  None. FINDINGS: Gallbladder: The gallbladder is contracted which limits evaluation. No  gallstones are noted. There is no gallbladder wall thickening. The sonographic Eulah Pont sign is negative. Common bile duct: Diameter: 3 mm Liver: No focal lesion identified. Within normal limits in parenchymal echogenicity. Portal vein is patent on color Doppler imaging with normal direction of blood flow towards the liver. Other: None. IMPRESSION: No acute abnormality. No evidence for cholelithiasis or acute cholecystitis. Electronically Signed   By: Katherine Mantle M.D.   On: 10/08/2019 17:36    ____________________________________________   Procedures   ____________________________________________   INITIAL IMPRESSION / MDM / ASSESSMENT AND PLAN / ED COURSE  As part  of my medical decision making, I reviewed the following data within the electronic MEDICAL RECORD NUMBER  34 year old female presented with above-stated history and physical exam differential diagnosis including but not limited to pancreatitis, gastritis cholecystitis.  Laboratory data revealed a lipase of 166 CT and ultrasound was performed while the patient was in the lobby which revealed mild pancreatitis.  Patient tremulous with tongue fasciculations during my evaluation and as such Ativan 1 mg IV was administered.  On reevaluation patient no longer tremulous not tachycardic without any confusion.  Patient was offered detox however refused. ____________________________________________  FINAL CLINICAL IMPRESSION(S) / ED DIAGNOSES  Final diagnoses:  Alcohol-induced acute pancreatitis, unspecified complication status     MEDICATIONS GIVEN DURING THIS VISIT:  Medications  LORazepam (ATIVAN) injection 1 mg (1 mg Intravenous Given 10/08/19 1910)  iohexol (OMNIPAQUE) 300 MG/ML solution 100 mL (100 mLs Intravenous Contrast Given 10/08/19 2136)  LORazepam (ATIVAN) injection 1 mg (1 mg Intravenous Given 10/09/19 0023)     ED Discharge Orders    None      *Please note:  Sherry Salas was evaluated in Emergency Department on 10/09/2019 for the symptoms described in the history of present illness. She was evaluated in the context of the global COVID-19 pandemic, which necessitated consideration that the patient might be at risk for infection with the SARS-CoV-2 virus that causes COVID-19. Institutional protocols and algorithms that pertain to the evaluation of patients at risk for COVID-19 are in a state of rapid change based on information released by regulatory bodies including the CDC and federal and state organizations. These policies and algorithms were followed during the patient's care in the ED.  Some ED evaluations and interventions may be delayed as a result of limited staffing during and after the  pandemic.*  Note:  This document was prepared using Dragon voice recognition software and may include unintentional dictation errors.   Darci Current, MD 10/09/19 [phone]

## 2019-10-09 NOTE — ED Notes (Signed)
ED Provider at bedside. 

## 2020-09-25 DIAGNOSIS — Z113 Encounter for screening for infections with a predominantly sexual mode of transmission: Secondary | ICD-10-CM | POA: Diagnosis not present

## 2020-09-25 DIAGNOSIS — O26891 Other specified pregnancy related conditions, first trimester: Secondary | ICD-10-CM | POA: Diagnosis not present

## 2020-09-25 DIAGNOSIS — O200 Threatened abortion: Secondary | ICD-10-CM | POA: Diagnosis not present

## 2020-09-25 DIAGNOSIS — Z124 Encounter for screening for malignant neoplasm of cervix: Secondary | ICD-10-CM | POA: Diagnosis not present

## 2020-09-25 DIAGNOSIS — N898 Other specified noninflammatory disorders of vagina: Secondary | ICD-10-CM | POA: Diagnosis not present

## 2020-10-10 DIAGNOSIS — O99331 Smoking (tobacco) complicating pregnancy, first trimester: Secondary | ICD-10-CM | POA: Diagnosis not present

## 2020-10-10 DIAGNOSIS — Z8279 Family history of other congenital malformations, deformations and chromosomal abnormalities: Secondary | ICD-10-CM | POA: Diagnosis not present

## 2020-10-10 DIAGNOSIS — Z8489 Family history of other specified conditions: Secondary | ICD-10-CM | POA: Diagnosis not present

## 2020-10-10 DIAGNOSIS — O0991 Supervision of high risk pregnancy, unspecified, first trimester: Secondary | ICD-10-CM | POA: Diagnosis not present

## 2020-10-24 DIAGNOSIS — O0991 Supervision of high risk pregnancy, unspecified, first trimester: Secondary | ICD-10-CM | POA: Diagnosis not present

## 2020-11-08 DIAGNOSIS — O021 Missed abortion: Secondary | ICD-10-CM | POA: Diagnosis not present

## 2020-11-08 DIAGNOSIS — O200 Threatened abortion: Secondary | ICD-10-CM | POA: Diagnosis not present

## 2021-03-31 NOTE — L&D Delivery Note (Signed)
Delivery Note  Date of delivery: 03/13/2022 Estimated Date of Delivery: 03/13/22 Patient's last menstrual period was 06/06/2021. EGA: [redacted]w[redacted]d  Delivery Note At 7:26 PM a viable female was delivered via Vaginal, Spontaneous (Presentation: Right Occiput Anterior).  APGAR: 8, 9; weight pending.  Placenta status: Spontaneous, Intact.  Cord: 3 vessels with the following complications: None.    First Stage: Labor onset: unknown Augmentation : Pitocin and AROM Analgesia /Anesthesia intrapartum: Epidural AROM at 1816  Sherry Salas presented to L&D with active labor. She was augmented with pitocin. Epidural placed for pain relief.   Second Stage: Complete dilation at 1816 Onset of pushing at 1900 FHR second stage Cat I Delivery at 1926 on 03/13/2022  She progressed to complete and had a spontaneous vaginal birth of a live female over an intact perineum. The fetal head was delivered in OA position with restitution to ROA. No nuchal cord. Anterior then posterior shoulders delivered spontaneously. Baby placed on mom's abdomen and attended to by transition RN. Cord clamped and cut after 1+ mins by FOB. Cord blood obtained for newborn labs.  Third Stage: Placenta delivered intact with 3VC at 1932 Placenta disposition: routine disposal Uterine tone frim / bleeding min IV pitocin given for hemorrhage prophylaxis  Anesthesia: Epidural Episiotomy: None Lacerations: 2nd degree;Perineal;Vaginal Suture Repair: 2.0 vicryl Est. Blood Loss (mL):  150  Complications: none  Mom to postpartum.  Baby to Couplet care / Skin to Skin.  Newborn: Birth Weight: pending  Apgar Scores: 8, 9 Feeding planned: Breastfeeding   Sherry Salas, CNM 03/13/2022 8:09 PM

## 2021-07-29 DIAGNOSIS — N912 Amenorrhea, unspecified: Secondary | ICD-10-CM | POA: Diagnosis not present

## 2021-07-29 DIAGNOSIS — N911 Secondary amenorrhea: Secondary | ICD-10-CM | POA: Diagnosis not present

## 2021-08-28 DIAGNOSIS — O0991 Supervision of high risk pregnancy, unspecified, first trimester: Secondary | ICD-10-CM | POA: Diagnosis not present

## 2021-08-28 LAB — OB RESULTS CONSOLE RUBELLA ANTIBODY, IGM: Rubella: IMMUNE

## 2021-08-28 LAB — OB RESULTS CONSOLE VARICELLA ZOSTER ANTIBODY, IGG: Varicella: IMMUNE

## 2021-08-28 LAB — OB RESULTS CONSOLE HEPATITIS B SURFACE ANTIGEN: Hepatitis B Surface Ag: NEGATIVE

## 2021-08-28 LAB — OB RESULTS CONSOLE RPR: RPR: NONREACTIVE

## 2021-09-04 ENCOUNTER — Telehealth: Payer: Self-pay | Admitting: Licensed Clinical Social Worker

## 2021-09-04 NOTE — Telephone Encounter (Signed)
-----   Message from Esmeralda Links, RN sent at 09/03/2021  3:46 PM EDT ----- Regarding: Referral Hi Marchelle Folks I hope that you are doing well. Please accept this as a referral for this client. She reports that she had anxiety/depression in the past at 79 and was on prozac. She reports that she has not been on medication for some time. She is currently suffering anxiety related to this pregnancy and 2 recent losses, one at 12 weeks.   Please contact her soon  [place]

## 2021-09-05 DIAGNOSIS — O09292 Supervision of pregnancy with other poor reproductive or obstetric history, second trimester: Secondary | ICD-10-CM | POA: Diagnosis not present

## 2021-09-10 ENCOUNTER — Ambulatory Visit: Payer: Medicaid Other | Admitting: Licensed Clinical Social Worker

## 2021-09-10 DIAGNOSIS — F4322 Adjustment disorder with anxiety: Secondary | ICD-10-CM
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
## 2021-09-17 ENCOUNTER — Ambulatory Visit: Payer: Medicaid Other | Admitting: Licensed Clinical Social Worker

## 2021-09-17 DIAGNOSIS — F4322 Adjustment disorder with anxiety: Secondary | ICD-10-CM

## 2021-09-17 NOTE — Progress Notes (Signed)
Counselor/Therapist Progress Note  Patient ID: Sherry Salas, MRN: [id number],    Date: 09/17/2021  Time Spent: 50 minutes   Treatment Type: Psychotherapy  Reported Symptoms:  Anxiety, anxious thoughts  Mental Status Exam:  Appearance:   Casual, Neat, and Well Groomed     Behavior:  Appropriate and Sharing  Motor:  Normal  Speech/Language:   Clear and Coherent and Normal Rate  Affect:  Appropriate, Congruent, and Full Range  Mood:  normal  Thought process:  normal  Thought content:    WNL  Sensory/Perceptual disturbances:    WNL  Orientation:  oriented to person, place, time/date, and situation  Attention:  Good  Concentration:  Good  Memory:  WNL  Fund of knowledge:   Good  Insight:    Good  Judgment:   Good  Impulse Control:  Good   Risk Assessment: Danger to Self:  No Self-injurious Behavior: No Danger to Others: No Duty to Warn:no Physical Aggression / Violence:No  Access to Firearms a concern: No  Gang Involvement:No   Subjective: Patient was engaged and cooperative throughout the session using time effectively to discuss previous session, treatment plan, thoughts, and feelings. Patient voices continued motivation for treatment and understanding of CBTS. Patient is likely to benefit from future treatment because she is motivated to decrease anxiety symptoms.     Interventions: Cognitive Behavioral Therapy  Checked in with patient and reviewed previous session, including assessment and goal of treatment. Reviewed CBTs. Continued building rapport and understanding of patient's challenges. Explored patient's goal of treatment and worked collaboratively to develop CBTs treatment plan. Provided support through active listening, validation of feelings, and highlighted patient's strengths.  Diagnosis:   ICD-10-CM   1. Adjustment disorder with anxious mood  F43.22       Plan: Patient's goal of treatment is to be able to recognize thoughts, demand evidence for thoughts,  enjoy the moment, be more optimistic and also wants to connect with this baby.   Treatment Target: Understand the relationship between thoughts, emotions, and behaviors  Psychoeducation on CBT model   Oriented the client to the therapeutic approach Teach the connection between thoughts, emotions, and behaviors  Treatment Target: Increase realistic balanced thinking -to learn how to replace thinking with thoughts that are more accurate or helpful Explore patient's thoughts, beliefs, automatic thoughts, assumptions  Identify and replace unhelpful thinking patterns (upsetting ideas, self-talk and mental images) Process distress and allow for emotional release  Questioning and challenging thoughts Cognitive reappraisal  Restructuring, Socratic questioning  Treatment Target: Increase Contact With the Present Moment  Explore client's ability to be "in-touch" with the present moment Teach mindfulness skills  Noting or describing  Body scan meditation  Loving-Kindness meditation Anchor breathing - mindful grounding   Future Appointments  Date Time Provider Department Center  09/23/2021  9:30 AM Kathreen Cosier, LCSW [facility] None    Kathreen Cosier, LCSW

## 2021-09-18 DIAGNOSIS — R87618 Other abnormal cytological findings on specimens from cervix uteri: Secondary | ICD-10-CM | POA: Diagnosis not present

## 2021-09-23 ENCOUNTER — Ambulatory Visit: Payer: Medicaid Other | Admitting: Licensed Clinical Social Worker

## 2021-09-23 DIAGNOSIS — F4322 Adjustment disorder with anxiety: Secondary | ICD-10-CM

## 2021-09-26 ENCOUNTER — Other Ambulatory Visit: Payer: Self-pay

## 2021-09-26 DIAGNOSIS — Z3689 Encounter for other specified antenatal screening: Secondary | ICD-10-CM

## 2021-10-03 ENCOUNTER — Ambulatory Visit: Payer: Medicaid Other | Admitting: Licensed Clinical Social Worker

## 2021-10-03 DIAGNOSIS — F4322 Adjustment disorder with anxiety: Secondary | ICD-10-CM

## 2021-10-03 NOTE — Progress Notes (Signed)
Counselor/Therapist Progress Note  Patient ID: Sherry Salas, MRN: [id number],    Date: 10/03/2021  Time Spent: 43 minutes   Treatment Type: Individual Therapy  Reported Symptoms:  mild anxiety, anxious thoughts   Mental Status Exam:  Appearance:   Casual     Behavior:  Appropriate and Sharing  Motor:  Normal  Speech/Language:   Clear and Coherent and Normal Rate  Affect:  Appropriate, Congruent, and Full Range  Mood:  normal  Thought process:  normal  Thought content:    WNL  Sensory/Perceptual disturbances:    WNL  Orientation:  oriented to person, place, time/date, and situation  Attention:  Good  Concentration:  Good  Memory:  WNL  Fund of knowledge:   Good  Insight:    Good  Judgment:   Good  Impulse Control:  Good   Risk Assessment: Danger to Self:  No Self-injurious Behavior: No Danger to Others: No Duty to Warn:no Physical Aggression / Violence:No  Access to Firearms a concern: No  Gang Involvement:No   Subjective: Patient was engaged and cooperative throughout the session using time effectively to discuss thoughts and feelings. Patient voices daily guided meditation. Patient is likely to benefit from future treatment because she remains motivated to decrease anxiety and reports benefit of regular sessions in addressing these symptoms.   Interventions: Cognitive Behavioral Therapy Established psychological safety. Checked in with patient regarding her week. Reviewed previous session regarding mindfulness meditation. Engaged patient in processing thoughts and feelings related to relationship challenges. LCSW reviewed noticing skills, automatic thoughts, and communication skills with patient.  Provided support through active listening, validation of feelings, and highlighted patient's strengths.   Diagnosis:   ICD-10-CM   1. Adjustment disorder with anxious mood  F43.22       Plan: Patient's goal of treatment is to be able to recognize thoughts, demand evidence  for thoughts, enjoy the moment, be more optimistic and also wants to connect with this baby.    Treatment Target: Understand the relationship between thoughts, emotions, and behaviors  Psychoeducation on CBT model   Oriented the client to the therapeutic approach Teach the connection between thoughts, emotions, and behaviors  Treatment Target: Increase realistic balanced thinking -to learn how to replace thinking with thoughts that are more accurate or helpful Explore patient's thoughts, beliefs, automatic thoughts, assumptions  Identify and replace unhelpful thinking patterns (upsetting ideas, self-talk and mental images) Process distress and allow for emotional release  Questioning and challenging thoughts Cognitive reappraisal  Restructuring, Socratic questioning  Treatment Target: Increase Contact With the Present Moment  Explore client's ability to be "in-touch" with the present moment Teach mindfulness skills  Noting or describing  Body scan meditation  Loving-Kindness meditation Anchor breathing - mindful grounding   Future Appointments  Date Time Provider Department Center  10/08/2021  9:20 AM Kathreen Cosier, LCSW [facility] None  11/05/2021  3:00 PM [facility]  11/05/2021  4:00 PM [facility] CONSULT [facility] None    Kathreen Cosier, LCSW

## 2021-10-08 ENCOUNTER — Ambulatory Visit: Payer: Medicaid Other | Admitting: Licensed Clinical Social Worker

## 2021-10-08 DIAGNOSIS — F4322 Adjustment disorder with anxiety: Secondary | ICD-10-CM

## 2021-10-08 NOTE — Progress Notes (Signed)
Counselor/Therapist Progress Note  Patient ID: Sherry Salas, MRN: [id number],    Date: 10/08/2021  Time Spent: 45 minutes    Treatment Type: Psychotherapy  Reported Symptoms:  mild anxiety, anxious thoughts  Mental Status Exam:  Appearance:   Casual and Well Groomed     Behavior:  Appropriate and Sharing  Motor:  Normal  Speech/Language:   Clear and Coherent and Normal Rate  Affect:  Appropriate, Congruent, and Full Range  Mood:  normal  Thought process:  normal  Thought content:    WNL  Sensory/Perceptual disturbances:    WNL  Orientation:  oriented to person, place, time/date, and situation  Attention:  Good  Concentration:  Good  Memory:  WNL  Fund of knowledge:   Good  Insight:    Good  Judgment:   Good  Impulse Control:  Good   Risk Assessment: Danger to Self:  No Self-injurious Behavior: No Danger to Others: No Duty to Warn:no Physical Aggression / Violence:No  Access to Firearms a concern: No  Gang Involvement:No   Subjective: Patient was receptive to feedback and intervention from LCSW and actively and effectively participated throughout the session. Patient is likely to benefit from future treatment because she remains motivated to decrease anxiety symptoms and reports benefit of regular sessions in addressing these symptoms.    Interventions: Cognitive Behavioral Therapy Established psychological safety. Checked in with patient regarding her week. Reviewed previous session regarding communication strategies with spouse. Provided supportive space encouraging emotional release and processing of current psychosocial stressors - marital challenges. LCSW discussed with patient noticing thoughts vs emotions and communication stratgies, including setting up rules to help with conflict in relationships. Provided support through active listening, validation of feelings, and highlighted patient's strengths.   Diagnosis:   ICD-10-CM   1. Adjustment disorder with anxious  mood  F43.22       Plan: Patient's goal of treatment is to be able to recognize thoughts, demand evidence for thoughts, enjoy the moment, be more optimistic and also wants to connect with this baby.    Treatment Target: Understand the relationship between thoughts, emotions, and behaviors  Psychoeducation on CBT model   Oriented the client to the therapeutic approach Teach the connection between thoughts, emotions, and behaviors  Treatment Target: Increase realistic balanced thinking -to learn how to replace thinking with thoughts that are more accurate or helpful Explore patient's thoughts, beliefs, automatic thoughts, assumptions  Identify and replace unhelpful thinking patterns (upsetting ideas, self-talk and mental images) Process distress and allow for emotional release  Questioning and challenging thoughts Cognitive reappraisal  Restructuring, Socratic questioning  Treatment Target: Increase Contact With the Present Moment  Explore client's ability to be "in-touch" with the present moment Teach mindfulness skills  Noting or describing  Body scan meditation  Loving-Kindness meditation Anchor breathing - mindful grounding   Future Appointments  Date Time Provider Department Center  10/15/2021  9:20 AM Kathreen Cosier, LCSW [facility] None  11/05/2021  3:00 PM [facility]  11/05/2021  4:00 PM [facility]ULT [facility] None   Kathreen Cosier, LCSW

## 2021-10-15 ENCOUNTER — Ambulatory Visit: Payer: Medicaid Other | Admitting: Licensed Clinical Social Worker

## 2021-10-15 DIAGNOSIS — F4322 Adjustment disorder with anxiety: Secondary | ICD-10-CM

## 2021-10-15 NOTE — Progress Notes (Signed)
Counselor/Therapist Progress Note  Patient ID: Sherry Salas, MRN: [id number],    Date: 10/15/2021  Time Spent: 50 minutes   Treatment Type: Psychotherapy  Reported Symptoms:  Stable mood; mild anxiety, anxious thoughts  Mental Status Exam:  Appearance:   Neat and Well Groomed     Behavior:  Appropriate and Sharing  Motor:  Normal  Speech/Language:   Clear and Coherent and Normal Rate  Affect:  Appropriate, Congruent, and Full Range  Mood:  normal  Thought process:  normal  Thought content:    WNL  Sensory/Perceptual disturbances:    WNL  Orientation:  oriented to person, place, time/date, and situation  Attention:  Good  Concentration:  Good  Memory:  WNL  Fund of knowledge:   Good  Insight:    Good  Judgment:   Good  Impulse Control:  Good   Risk Assessment: Danger to Self:  No Self-injurious Behavior: No Danger to Others: No Duty to Warn:no Physical Aggression / Violence:No  Access to Firearms a concern: No  Gang Involvement:No   Subjective: Patient was receptive to feedback and intervention from LCSW and actively and effectively participated throughout the session. Patient reports daily use of mindfulness and reports benefit. She also reports increased communication with husband. Patient is likely to benefit from future treatment because she remains motivated to decrease anxiety and reports benefit of regular sessions in addressing these symptoms.    Interventions: Cognitive Behavioral Therapy and Mindfulness Meditation Established psychological safety. Checked in with patient regarding her week. Reviewed use of mindfulness meditation. Reviewed previous session regarding marital challenges. Taught patient about Core Beliefs and automatic thoughts, assisting patient in identifying and countering underlying beliefs. Provided support through active listening, validation of feelings, and highlighted patient's strengths.   Diagnosis:   ICD-10-CM   1. Adjustment disorder  with anxious mood  F43.22      Plan:  Check in on Core Beliefs and Teach Patient about Unhelpful Thoughts  Patient's goal of treatment is to be able to recognize thoughts, demand evidence for thoughts, enjoy the moment, be more optimistic and also wants to connect with this baby.    Treatment Target: Understand the relationship between thoughts, emotions, and behaviors  Psychoeducation on CBT model   Oriented the client to the therapeutic approach Teach the connection between thoughts, emotions, and behaviors  Treatment Target: Increase realistic balanced thinking -to learn how to replace thinking with thoughts that are more accurate or helpful Explore patient's thoughts, beliefs, automatic thoughts, assumptions  Identify and replace unhelpful thinking patterns (upsetting ideas, self-talk and mental images) Process distress and allow for emotional release  Questioning and challenging thoughts Cognitive reappraisal  Restructuring, Socratic questioning  Treatment Target: Increase Contact With the Present Moment  Explore client's ability to be "in-touch" with the present moment Teach mindfulness skills  Noting or describing  Body scan meditation  Loving-Kindness meditation Anchor breathing - mindful grounding   Future Appointments  Date Time Provider Department Center  10/22/2021  9:20 AM Kathreen Cosier, LCSW [facility] None  11/05/2021  3:00 PM [facility]  11/05/2021  4:00 PM [facility] None    Kathreen Cosier, LCSW

## 2021-10-22 ENCOUNTER — Ambulatory Visit: Payer: Medicaid Other | Admitting: Licensed Clinical Social Worker

## 2021-10-22 DIAGNOSIS — F4322 Adjustment disorder with anxiety: Secondary | ICD-10-CM

## 2021-10-22 NOTE — Progress Notes (Signed)
Counselor/Therapist Progress Note  Patient ID: Sherry Salas, MRN: [id number],    Date: 10/22/2021  Time Spent: 45 minutes   Treatment Type: Psychotherapy  Reported Symptoms:  Mild anxiety, anxious thoughts  Mental Status Exam:  Appearance:   Casual, Neat, and Well Groomed     Behavior:  Appropriate and Sharing  Motor:  Normal  Speech/Language:   Clear and Coherent and Normal Rate  Affect:  Appropriate, Congruent, and Full Range  Mood:  normal  Thought process:  normal  Thought content:    WNL  Sensory/Perceptual disturbances:    WNL  Orientation:  oriented to person, place, time/date, and situation  Attention:  Good  Concentration:  Good  Memory:  WNL  Fund of knowledge:   Good  Insight:    Good  Judgment:   Good  Impulse Control:  Good   Risk Assessment: Danger to Self:  No Self-injurious Behavior: No Danger to Others: No Duty to Warn:no Physical Aggression / Violence:No  Access to Firearms a concern: No  Gang Involvement:No   Subjective: Patient was receptive to feedback and intervention from LCSW and actively and effectively participated throughout the session. Patient reports noticing thoughts and core beliefs. Patient is likely to benefit from future treatment because she remains motivated to decrease anxiety and reports benefit of regular sessions in addressing anxiety symptoms.    Interventions: Cognitive Behavioral Therapy Established psychological safety.Checked in with patient regarding her week. Reviewed previous session regarding core beliefs. Continued discussing thoughts, automatic thoughts and feelings. Taught patient STOP and encouraged patient to also use mindfulness based approaches. Provided support through active listening, validation of feelings, and highlighted patient's strengths.   Diagnosis:   ICD-10-CM   1. Adjustment disorder with anxious mood  F43.22      Plan: Teach Patient about Unhelpful Thoughts   Patient's goal of treatment is to be  able to recognize thoughts, demand evidence for thoughts, enjoy the moment, be more optimistic and also wants to connect with this baby.    Treatment Target: Understand the relationship between thoughts, emotions, and behaviors  Psychoeducation on CBT model   Oriented the client to the therapeutic approach Teach the connection between thoughts, emotions, and behaviors  Treatment Target: Increase realistic balanced thinking -to learn how to replace thinking with thoughts that are more accurate or helpful Explore patient's thoughts, beliefs, automatic thoughts, assumptions  Identify and replace unhelpful thinking patterns (upsetting ideas, self-talk and mental images) Process distress and allow for emotional release  Questioning and challenging thoughts Cognitive reappraisal  Restructuring, Socratic questioning  Treatment Target: Increase Contact With the Present Moment  Explore client's ability to be "in-touch" with the present moment Teach mindfulness skills  Noting or describing  Body scan meditation  Loving-Kindness meditation Anchor breathing - mindful grounding   Future Appointments  Date Time Provider Department Center  10/28/2021 10:30 AM Kathreen Cosier, LCSW [facility] None  11/05/2021 11:00 AM [facility]  11/05/2021 12:00 PM [facility] None   Kathreen Cosier, LCSW

## 2021-10-28 ENCOUNTER — Ambulatory Visit: Payer: Medicaid Other | Admitting: Licensed Clinical Social Worker

## 2021-10-28 DIAGNOSIS — F4322 Adjustment disorder with anxiety: Secondary | ICD-10-CM

## 2021-10-28 NOTE — Progress Notes (Signed)
Counselor/Therapist Progress Note  Patient ID: Sherry Salas, MRN: [id number],    Date: 10/28/2021  Time Spent: 45 minutes    Treatment Type: Psychotherapy  Reported Symptoms:  mild anxiety symptoms, anxious thoughts, mild avoidance   Mental Status Exam:  Appearance:   Casual, Neat, and Well Groomed     Behavior:  Appropriate and Sharing  Motor:  Normal  Speech/Language:   Clear and Coherent and Normal Rate  Affect:  Appropriate and Congruent  Mood:  normal  Thought process:  normal  Thought content:    WNL  Sensory/Perceptual disturbances:    WNL  Orientation:  oriented to person, place, time/date, and situation  Attention:  Good  Concentration:  Good  Memory:  WNL  Fund of knowledge:   Good  Insight:    Good  Judgment:   Good  Impulse Control:  Good   Risk Assessment: Danger to Self:  No Self-injurious Behavior: No Danger to Others: No Duty to Warn:no Physical Aggression / Violence:No  Access to Firearms a concern: No  Gang Involvement:No   Subjective: Patient was engaged and cooperative throughout the session using time effectively to discuss thoughts and feelings. Patient was receptive to feedback and intervention from LCSW. Patient voices continued use of daily mindfulness guided meditations. Patient is likely to benefit from future treatment because they remain motivated to manage anxiety and improve functioning and reports benefit of regular sessions.      Interventions: Cognitive Behavioral Therapy and Mindfulness Meditation Established psychological safety. Checked in with patient regarding their week and current symptoms and current psychosocial stressors, overall managed anxiety; interactions with in-laws; experiences as a pregnant mother. Marland Kitchen LCSW assisted patient in processing their thoughts and emotions about what they have experienced in engaging with in-laws and as a pregnant mother, Validated and normalized patient's feelings and experiences. LCSW reviewed  mindfulness and taught patient about single tasking. Provided support through active listening, validation of feelings, and highlighted patient's strengths.  Single tasking   Diagnosis:   ICD-10-CM   1. Adjustment disorder with anxious mood  F43.22       Plan: Check in on mindfulness (ACT inspired) guided meditations. Teach Patient about Unhelpful Thoughts   Patient's goal of treatment is to be able to recognize thoughts, demand evidence for thoughts, enjoy the moment, be more optimistic and also wants to connect with this baby.    Treatment Target: Understand the relationship between thoughts, emotions, and behaviors  Psychoeducation on CBT model   Oriented the client to the therapeutic approach Teach the connection between thoughts, emotions, and behaviors  Treatment Target: Increase realistic balanced thinking -to learn how to replace thinking with thoughts that are more accurate or helpful Explore patient's thoughts, beliefs, automatic thoughts, assumptions  Identify and replace unhelpful thinking patterns (upsetting ideas, self-talk and mental images) Process distress and allow for emotional release  Questioning and challenging thoughts Cognitive reappraisal  Restructuring, Socratic questioning  Treatment Target: Increase Contact With the Present Moment  Explore client's ability to be "in-touch" with the present moment Teach mindfulness skills  Noting or describing  Body scan meditation  Loving-Kindness meditation Anchor breathing - mindful grounding   Future Appointments  Date Time Provider Department Center  11/04/2021  9:00 AM Kathreen Cosier, LCSW [facility] None  11/05/2021 11:00 AM [facility]  11/05/2021 12:00 PM [facility] None    Kathreen Cosier, LCSW

## 2021-11-04 ENCOUNTER — Ambulatory Visit: Payer: Medicaid Other | Admitting: Licensed Clinical Social Worker

## 2021-11-04 DIAGNOSIS — F4322 Adjustment disorder with anxiety: Secondary | ICD-10-CM

## 2021-11-04 NOTE — Progress Notes (Signed)
Counselor/Therapist Progress Note  Patient ID: Sherry Salas, MRN: [id number],    Date: 11/04/2021  Time Spent: 50 minutes    Treatment Type: Psychotherapy  Reported Symptoms:  mild anxiety, anxious thoughts   Mental Status Exam:  Appearance:   Casual, Neat, and Well Groomed     Behavior:  Normal  Motor:  Normal  Speech/Language:   Clear and Coherent and Normal Rate  Affect:  Appropriate, Congruent, and Full Range  Mood:  normal  Thought process:  normal  Thought content:    WNL  Sensory/Perceptual disturbances:    WNL  Orientation:  oriented to person, place, time/date, and situation  Attention:  Good  Concentration:  Good  Memory:  WNL  Fund of knowledge:   Good  Insight:    Good  Judgment:   Good  Impulse Control:  Good   Risk Assessment: Danger to Self:  No Self-injurious Behavior: No Danger to Others: No Duty to Warn:no Physical Aggression / Violence:No  Access to Firearms a concern: No  Gang Involvement:No   Subjective: Patient was receptive to feedback and intervention from LCSW and actively and effectively participated throughout the session. Patient reports benefit from mindfulness guided meditations and single tasking. Patient is likely to benefit from future treatment because she remains motivated to decrease anxiety and reports benefit of regular sessions in addressing these symptoms.    Interventions: Cognitive Behavioral Therapy and Mindfulness Meditation Established psychological safety. Checked in with patient regarding their week. LCSW assisted patient in processing their thoughts and emotions about stressors related to getting things ready for new baby. LCSW taught patient about Gratitude statements, Journaling, listing what you do know vs what you do not know. And reviewed single tasking and Leaves on the stream from previous session. LCSW provided psychoeducation and support around nicotine cessation and encouraged client to use QUIT line for extra support  and nicotine replacement, if needed. Provided support through active listening, validation of feelings, and highlighted patient's strengths.   Diagnosis:   ICD-10-CM   1. Adjustment disorder with anxious mood  F43.22      Plan: Teach Patient about Unhelpful Thoughts   Patient's goal of treatment is to be able to recognize thoughts, demand evidence for thoughts, enjoy the moment, be more optimistic and also wants to connect with this baby.    Treatment Target: Understand the relationship between thoughts, emotions, and behaviors  Psychoeducation on CBT model   Oriented the client to the therapeutic approach Teach the connection between thoughts, emotions, and behaviors  Treatment Target: Increase realistic balanced thinking -to learn how to replace thinking with thoughts that are more accurate or helpful Explore patient's thoughts, beliefs, automatic thoughts, assumptions  Identify and replace unhelpful thinking patterns (upsetting ideas, self-talk and mental images) Process distress and allow for emotional release  Questioning and challenging thoughts Cognitive reappraisal  Restructuring, Socratic questioning  Treatment Target: Increase Contact With the Present Moment  Explore client's ability to be "in-touch" with the present moment Teach mindfulness skills  Noting or describing  Body scan meditation  Loving-Kindness meditation Anchor breathing - mindful grounding   Future Appointments  Date Time Provider Department Center  11/05/2021 11:00 AM [facility]  11/05/2021 12:00 PM [facility] None  11/11/2021  9:00 AM Kathreen Cosier, LCSW [facility] None     Kathreen Cosier, LCSW

## 2021-11-05 ENCOUNTER — Other Ambulatory Visit: Payer: Self-pay

## 2021-11-05 ENCOUNTER — Ambulatory Visit (HOSPITAL_BASED_OUTPATIENT_CLINIC_OR_DEPARTMENT_OTHER): Payer: Medicaid Other | Admitting: Obstetrics

## 2021-11-05 ENCOUNTER — Ambulatory Visit: Payer: Medicaid Other | Attending: Obstetrics

## 2021-11-05 DIAGNOSIS — Z3A21 21 weeks gestation of pregnancy: Secondary | ICD-10-CM

## 2021-11-05 DIAGNOSIS — F17210 Nicotine dependence, cigarettes, uncomplicated: Secondary | ICD-10-CM

## 2021-11-05 DIAGNOSIS — Q7962 Hypermobile Ehlers-Danlos syndrome: Secondary | ICD-10-CM

## 2021-11-05 DIAGNOSIS — Z3689 Encounter for other specified antenatal screening: Secondary | ICD-10-CM

## 2021-11-05 DIAGNOSIS — O99891 Other specified diseases and conditions complicating pregnancy: Secondary | ICD-10-CM | POA: Diagnosis not present

## 2021-11-05 DIAGNOSIS — O09522 Supervision of elderly multigravida, second trimester: Secondary | ICD-10-CM | POA: Diagnosis not present

## 2021-11-05 DIAGNOSIS — O359XX0 Maternal care for (suspected) fetal abnormality and damage, unspecified, not applicable or unspecified: Secondary | ICD-10-CM

## 2021-11-05 DIAGNOSIS — O352XX0 Maternal care for (suspected) hereditary disease in fetus, not applicable or unspecified: Secondary | ICD-10-CM

## 2021-11-05 DIAGNOSIS — Q796 Ehlers-Danlos syndrome, unspecified: Secondary | ICD-10-CM

## 2021-11-05 DIAGNOSIS — O99332 Smoking (tobacco) complicating pregnancy, second trimester: Secondary | ICD-10-CM | POA: Diagnosis not present

## 2021-11-05 DIAGNOSIS — Z363 Encounter for antenatal screening for malformations: Secondary | ICD-10-CM | POA: Diagnosis not present

## 2021-11-05 DIAGNOSIS — Z8279 Family history of other congenital malformations, deformations and chromosomal abnormalities: Secondary | ICD-10-CM | POA: Diagnosis not present

## 2021-11-05 NOTE — Progress Notes (Signed)
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  She reports that she was diagnosed with EDS as a child.  She has had numerous dislocations and subluxations of her joints throughout her life.  She also has experienced skin laxity and poor wound healing.  Her last echocardiogram was performed when she was a child.    Her mother and her aunts have all been diagnosed with Ehlers-Danlos syndrome.  Her husband was born with both a cleft lip and cleft palate.  She denies any other significant past medical history and denies any problems in her current pregnancy.    She had a cell free DNA test earlier in her pregnancy which indicated a low risk for trisomy 30, 92, and 13. A female fetus is predicted.  She was informed that the fetal growth and amniotic fluid level were appropriate for her gestational age.   There were no obvious fetal anomalies noted on today's ultrasound exam.  There were no signs of a cleft lip or a cleft palate noted in her fetus today.  The patient was informed that anomalies may be missed due to technical limitations. If the fetus is in a suboptimal position or maternal habitus is increased, visualization of the fetus in the maternal uterus may be impaired.  The following were discussed during today's consultation:  Ehlers-Danlos syndrome in pregnancy  The implications and management of Ehlers-Danlos syndrome in pregnancy was discussed in detail with the patient.  She was advised that Ehlers-Danlos syndrome in pregnancy has been associated with an increased risk of preterm labor, preterm delivery, and preterm rupture of membranes.   Her cervical length measured by a transabdominal scan today was 3.2 cm long without any signs of funneling.  The patient was reassured that her risk of a preterm birth is low based on the normal cervical length today.  She was reassured that most of the patients that  I have managed with Ehlers-Danlos syndrome had have successful pregnancy outcomes.  At the time of delivery, women with Ehlers-Danlos syndrome may be at risk for a rapid or precipitous delivery and at increased risk for perineal lacerations due to tissue fragility. These women may also be at increased risk for postpartum hemorrhage.   Due to Ehlers-Danlos syndrome, a maternal echocardiogram should probably be ordered with cardiology to assess for any maternal aortic/cardiac abnormalities.    As it appears that she has only very mild symptoms, hopefully she will most likely have an uneventful pregnancy.  Most Ehlers-Danlos syndromes are transmitted in an autosomal dominant fashion.  There are no genetic tests for type III EDS.    She was advised that if her condition is transmitted in an autosomal dominant fashion, then roughly 50% of her offspring will inherit this condition.    The patient was offered and declined a meeting with our genetic counselor today.    She also was offered and declined an amniocentesis today for definitive diagnosis of fetal aneuploidy.    A follow-up growth scan was scheduled in 6 weeks.  We will reassess the fetal palate again at that exam.    The patient and her's husband stated that all of their questions were answered today.  A total of 45 minutes was spent counseling and coordinating the care for this patient.  Greater than 50% of the time was spent in direct face-to-face contact.

## 2021-11-11 ENCOUNTER — Ambulatory Visit: Payer: Medicaid Other | Admitting: Licensed Clinical Social Worker

## 2021-11-11 DIAGNOSIS — F4322 Adjustment disorder with anxiety: Secondary | ICD-10-CM

## 2021-11-11 NOTE — Progress Notes (Signed)
Counselor/Therapist Progress Note  Patient ID: Sherry Salas, MRN: [id number],    Date: 11/11/2021  Time Spent: 45 minutes    Treatment Type: Psychotherapy  Reported Symptoms:  Anxiety, anxious thoughts  Mental Status Exam:  Appearance:   Casual and Well Groomed     Behavior:  Appropriate and Sharing  Motor:  Normal  Speech/Language:   Clear and Coherent and Normal Rate  Affect:  Appropriate, Congruent, and Full Range  Mood:  normal  Thought process:  normal  Thought content:    WNL  Sensory/Perceptual disturbances:    WNL  Orientation:  oriented to person, place, time/date, and situation  Attention:  Good  Concentration:  Good  Memory:  WNL  Fund of knowledge:   Good  Insight:    Good  Judgment:   Good  Impulse Control:  Good   Risk Assessment: Danger to Self:  No Self-injurious Behavior: No Danger to Others: No Duty to Warn:no Physical Aggression / Violence:No  Access to Firearms a concern: No  Gang Involvement:No   Subjective: Patient was engaged and cooperative throughout the session using time effectively to discuss thoughts and feelings. Patient reports continued anxiety and practicing mindfulness acceptance. Patient was receptive to feedback and intervention from LCSW. Patient voices continued motivation for treatment and is likely to benefit from future treatment because they remain motivated to decrease anxiety and improve functioning.      Interventions: Cognitive Behavioral Therapy Established psychological safety. Checked in with patient regarding their week. LCSW assisted patient in processing current psychosocial stressors, continued anxiety related to wellbeing of baby she is carrying. LCSW and patient discussed her fears due to history of pregnancy loss. LCSW engaged patient in processing her thoughts and emotions related to mom moving closer, and with concerns about child rasing and family dynamics with in-laws. Provided supportive space for patient to  process and release, validating patient's feelings, and highlighted patient's strengths of flexibility of thinking.   Diagnosis:   ICD-10-CM   1. Adjustment disorder with anxious mood  F43.22      Plan: Patient's goal of treatment is to be able to recognize thoughts, demand evidence for thoughts, enjoy the moment, be more optimistic and also wants to connect with this baby.    Treatment Target: Understand the relationship between thoughts, emotions, and behaviors  Psychoeducation on CBT model   Oriented the client to the therapeutic approach Teach the connection between thoughts, emotions, and behaviors  Treatment Target: Increase realistic balanced thinking -to learn how to replace thinking with thoughts that are more accurate or helpful Explore patient's thoughts, beliefs, automatic thoughts, assumptions  Identify and replace unhelpful thinking patterns (upsetting ideas, self-talk and mental images) Process distress and allow for emotional release  Questioning and challenging thoughts Cognitive reappraisal  Restructuring, Socratic questioning  Treatment Target: Increase Contact With the Present Moment  Explore client's ability to be "in-touch" with the present moment Teach mindfulness skills  Noting or describing  Body scan meditation  Loving-Kindness meditation Anchor breathing - mindful grounding   Future Appointments  Date Time Provider Department Center  11/19/2021  9:00 AM Kathreen Cosier, LCSW [facility] None  12/17/2021  2:00 PM [facility]    Kathreen Cosier, LCSW

## 2021-11-19 ENCOUNTER — Ambulatory Visit: Payer: Medicaid Other | Admitting: Licensed Clinical Social Worker

## 2021-11-19 DIAGNOSIS — F4322 Adjustment disorder with anxiety: Secondary | ICD-10-CM

## 2021-11-19 NOTE — Progress Notes (Signed)
Counselor/Therapist Progress Note  Patient ID: Sherry Salas, MRN: [id number],    Date: 11/19/2021  Time Spent: 43  minutes    Treatment Type: Psychotherapy  Reported Symptoms: Physical aches and pain and Mild anxiety, anxious thoughts  Mental Status Exam:  Appearance:   Casual and Well Groomed     Behavior:  Appropriate and Sharing  Motor:  Normal  Speech/Language:   Clear and Coherent and Normal Rate  Affect:  Appropriate, Congruent, and Full Range  Mood:  normal  Thought process:  normal  Thought content:    WNL  Sensory/Perceptual disturbances:    WNL  Orientation:  oriented to person, place, time/date, and situation  Attention:  Good  Concentration:  Good  Memory:  WNL  Fund of knowledge:   Good  Insight:    Good  Judgment:   Good  Impulse Control:  Good   Risk Assessment: Danger to Self:  No Self-injurious Behavior: No Danger to Others: No Duty to Warn:no Physical Aggression / Violence:No  Access to Firearms a concern: No  Gang Involvement:No   Subjective: Patient was engaged and cooperative throughout the session using time effectively to discuss thoughts and feelings. Patient voices increased anxiety and also reports a lot of physical pain leading to sleep disturbance. She reports slight decrease in use of guided meditations but plans to return to regular use as she reports benefit. Patient continued motivation and benefit from regular sessions.   Interventions: Cognitive Behavioral Therapy Established psychological safety. Checked in with patient regarding their week. LCSW assisted patient in processing their thoughts and emotions about current psychosocial stressors, sick family member (in-law side of the family), and pain related to pregnancy and Ehlers-Danlos syndrome. Validated patient's feelings and reviewed intentional breathing exercises, mindfulness moments and encouraged patient to return to regular use of guided meditations. Provided support through active  listening, validation of feelings, and highlighted patient's strengths.   Diagnosis:   ICD-10-CM   1. Adjustment disorder with anxious mood  F43.22      Plan: Patient's goal of treatment is to be able to recognize thoughts, demand evidence for thoughts, enjoy the moment, be more optimistic and also wants to connect with this baby.    Treatment Target: Understand the relationship between thoughts, emotions, and behaviors  Psychoeducation on CBT model   Oriented the client to the therapeutic approach Teach the connection between thoughts, emotions, and behaviors  Treatment Target: Increase realistic balanced thinking -to learn how to replace thinking with thoughts that are more accurate or helpful Explore patient's thoughts, beliefs, automatic thoughts, assumptions  Identify and replace unhelpful thinking patterns (upsetting ideas, self-talk and mental images) Process distress and allow for emotional release  Questioning and challenging thoughts Cognitive reappraisal  Restructuring, Socratic questioning  Treatment Target: Increase Contact With the Present Moment  Explore client's ability to be "in-touch" with the present moment Teach mindfulness skills  Noting or describing  Body scan meditation  Loving-Kindness meditation Anchor breathing - mindful grounding   Future Appointments  Date Time Provider Department Center  12/03/2021 10:00 AM Kathreen Cosier, LCSW [facility] None  12/17/2021  2:00 PM [facility]     Kathreen Cosier, LCSW

## 2021-12-03 ENCOUNTER — Ambulatory Visit: Payer: Medicaid Other | Admitting: Licensed Clinical Social Worker

## 2021-12-03 DIAGNOSIS — F4322 Adjustment disorder with anxiety: Secondary | ICD-10-CM

## 2021-12-03 NOTE — Progress Notes (Signed)
Counselor/Therapist Progress Note  Patient ID: Sherry Salas, MRN: [id number],    Date: 12/03/2021  Time Spent: 47 minutes    Treatment Type: Psychotherapy  Reported Symptoms:  Decrease in anxiousness, increased irritability   Mental Status Exam:  Appearance:   Casual, Neat, and Well Groomed     Behavior:  Appropriate and Sharing  Motor:  Normal  Speech/Language:   Clear and Coherent and Normal Rate  Affect:  Appropriate, Congruent, and Full Range  Mood:  euthymic  Thought process:  normal  Thought content:    WNL  Sensory/Perceptual disturbances:    WNL  Orientation:  oriented to person, place, time/date, and situation  Attention:  Good  Concentration:  Good  Memory:  WNL  Fund of knowledge:   Good  Insight:    Good  Judgment:   Good  Impulse Control:  Good   Risk Assessment: Danger to Self:  No Self-injurious Behavior: No Danger to Others: No Duty to Warn:no Physical Aggression / Violence:No  Access to Firearms a concern: No  Gang Involvement:No   Subjective: Patient was engaged and cooperative throughout the session using time effectively to discuss thoughts and feelings. Continued daily meditation practice, less anxiety increased irritability. Patient voices continued motivation for treatment and understanding of anxiety issues and reports benefit of regular sessions in addressing these symptoms.   Interventions: Cognitive Behavioral Therapy and Mindfulness Meditation Established psychological safety. Checked in with patient regarding their week. LCSW assisted patient in processing their thoughts and emotions about challenges with relationships. Explored patient's increased irritability, identifying points of intervention, including reframing thoughts leading to increased irritability, focusing on things she can change/address, and engagement in things/activities that fit within her values. Provided support through active listening, validation of feelings, and highlighted  patient's strengths.   Diagnosis:   ICD-10-CM   1. Adjustment disorder with anxious mood  F43.22      Plan: Patient's goal of treatment is to be able to recognize thoughts, demand evidence for thoughts, enjoy the moment, be more optimistic and also wants to connect with this baby.    Treatment Target: Understand the relationship between thoughts, emotions, and behaviors  Psychoeducation on CBT model   Oriented the client to the therapeutic approach Teach the connection between thoughts, emotions, and behaviors  Treatment Target: Increase realistic balanced thinking -to learn how to replace thinking with thoughts that are more accurate or helpful Explore patient's thoughts, beliefs, automatic thoughts, assumptions  Identify and replace unhelpful thinking patterns (upsetting ideas, self-talk and mental images) Process distress and allow for emotional release  Questioning and challenging thoughts Cognitive reappraisal  Restructuring, Socratic questioning  Treatment Target: Increase Contact With the Present Moment  Explore client's ability to be "in-touch" with the present moment Teach mindfulness skills  Noting or describing  Body scan meditation  Loving-Kindness meditation Anchor breathing - mindful grounding    Future Appointments  Date Time Provider Department Center  12/03/2021 10:00 AM Kathreen Cosier, LCSW [facility] None  12/17/2021  2:00 PM [facility]   Kathreen Cosier, LCSW

## 2021-12-10 ENCOUNTER — Ambulatory Visit: Payer: Medicaid Other | Admitting: Licensed Clinical Social Worker

## 2021-12-10 DIAGNOSIS — F4322 Adjustment disorder with anxiety: Secondary | ICD-10-CM

## 2021-12-10 NOTE — Progress Notes (Signed)
Counselor/Therapist Progress Note  Patient ID: Sherry Salas, MRN: [id number],    Date: 12/10/2021  Time Spent: 47 minutes    Treatment Type: Psychotherapy  Reported Symptoms:  Anxiety, anxious thoughts, overwhelm, low energy, avoidance    Mental Status Exam:  Appearance:   Casual, Neat, and Well Groomed     Behavior:  Appropriate and Sharing  Motor:  Normal  Speech/Language:   Clear and Coherent and Normal Rate  Affect:  Appropriate, Congruent, and Full Range  Mood:  normal  Thought process:  normal  Thought content:    WNL  Sensory/Perceptual disturbances:    WNL  Orientation:  oriented to person, place, time/date, and situation  Attention:  Good  Concentration:  Good  Memory:  WNL  Fund of knowledge:   Good  Insight:    Good  Judgment:   Good  Impulse Control:  Good   Risk Assessment: Danger to Self:  No Self-injurious Behavior: No Danger to Others: No Duty to Warn:no Physical Aggression / Violence:No  Access to Firearms a concern: No  Gang Involvement:No   Subjective: Patient was receptive to feedback and intervention from LCSW and actively and effectively participated throughout the session. Patient reports increase in irritability,and overwhelm she is using guided meditations daily. Patient remains motivated to decrease symptoms and improve functioning and reports benefit of regular sessions in addressing symptoms.    Interventions: Cognitive Behavioral Therapy Established psychological safety. Checked in with patient regarding their week. LCSW provided supportive space for patient to process their thoughts and emotions about frustrations with service providers, overwhelm with multiple tasks needling completed and challenges with in=laws.  LCSW reviewed mindfulness practices, noticing judgement, and reframed thoughts leading to distress. Provided support through active listening, validation of feelings, and highlighted patient's strengths.    Diagnosis:   ICD-10-CM    1. Adjustment disorder with anxious mood  F43.22      Plan: Patient's goal of treatment is to be able to recognize thoughts, demand evidence for thoughts, enjoy the moment, be more optimistic and also wants to connect with this baby.    Treatment Target: Understand the relationship between thoughts, emotions, and behaviors  Psychoeducation on CBT model   Oriented the client to the therapeutic approach Teach the connection between thoughts, emotions, and behaviors  Treatment Target: Increase realistic balanced thinking -to learn how to replace thinking with thoughts that are more accurate or helpful Explore patient's thoughts, beliefs, automatic thoughts, assumptions  Identify and replace unhelpful thinking patterns (upsetting ideas, self-talk and mental images) Process distress and allow for emotional release  Questioning and challenging thoughts Cognitive reappraisal  Restructuring, Socratic questioning  Treatment Target: Increase Contact With the Present Moment  Explore client's ability to be "in-touch" with the present moment Teach mindfulness skills  Noting or describing  Body scan meditation  Loving-Kindness meditation Anchor breathing - mindful grounding   Future Appointments  Date Time Provider Department Center  12/17/2021  2:00 PM [facility]  12/24/2021  9:00 AM Kathreen Cosier, LCSW [facility] None   Kathreen Cosier, LCSW

## 2021-12-11 DIAGNOSIS — Q7962 Hypermobile Ehlers-Danlos syndrome: Secondary | ICD-10-CM | POA: Diagnosis not present

## 2021-12-11 DIAGNOSIS — O09292 Supervision of pregnancy with other poor reproductive or obstetric history, second trimester: Secondary | ICD-10-CM | POA: Diagnosis not present

## 2021-12-11 DIAGNOSIS — O0992 Supervision of high risk pregnancy, unspecified, second trimester: Secondary | ICD-10-CM | POA: Diagnosis not present

## 2021-12-17 ENCOUNTER — Other Ambulatory Visit: Payer: Self-pay

## 2021-12-17 ENCOUNTER — Ambulatory Visit: Payer: Medicaid Other | Attending: Maternal & Fetal Medicine

## 2021-12-17 DIAGNOSIS — Q796 Ehlers-Danlos syndrome, unspecified: Secondary | ICD-10-CM | POA: Diagnosis not present

## 2021-12-17 DIAGNOSIS — F419 Anxiety disorder, unspecified: Secondary | ICD-10-CM

## 2021-12-17 DIAGNOSIS — J45909 Unspecified asthma, uncomplicated: Secondary | ICD-10-CM | POA: Insufficient documentation

## 2021-12-17 DIAGNOSIS — O99891 Other specified diseases and conditions complicating pregnancy: Secondary | ICD-10-CM | POA: Diagnosis not present

## 2021-12-17 DIAGNOSIS — O99512 Diseases of the respiratory system complicating pregnancy, second trimester: Secondary | ICD-10-CM | POA: Insufficient documentation

## 2021-12-17 DIAGNOSIS — Z3A27 27 weeks gestation of pregnancy: Secondary | ICD-10-CM | POA: Insufficient documentation

## 2021-12-18 DIAGNOSIS — O0992 Supervision of high risk pregnancy, unspecified, second trimester: Secondary | ICD-10-CM | POA: Diagnosis not present

## 2021-12-24 ENCOUNTER — Ambulatory Visit: Payer: Medicaid Other | Admitting: Licensed Clinical Social Worker

## 2021-12-24 DIAGNOSIS — F4322 Adjustment disorder with anxiety: Secondary | ICD-10-CM

## 2021-12-24 NOTE — Progress Notes (Signed)
Counselor/Therapist Progress Note  Patient ID: Sherry Salas, MRN: [id number],    Date: 12/24/2021  Time Spent: 45 minutes    Treatment Type: Psychotherapy  Reported Symptoms:  Anxiety, anxious thoughts and worries  Mental Status Exam:  Appearance:   Casual and Well Groomed     Behavior:  Appropriate and Sharing  Motor:  Normal  Speech/Language:   Clear and Coherent and Normal Rate  Affect:  Appropriate, Congruent, and Full Range  Mood:  normal  Thought process:  normal  Thought content:    WNL  Sensory/Perceptual disturbances:    WNL  Orientation:  oriented to person, place, time/date, and situation  Attention:  Good  Concentration:  Good  Memory:  WNL  Fund of knowledge:   Good  Insight:    Good  Judgment:   Good  Impulse Control:  Good   Risk Assessment: Danger to Self:  No Self-injurious Behavior: No Danger to Others: No Duty to Warn:no Physical Aggression / Violence:No  Access to Firearms a concern: No  Gang Involvement:No   Subjective: Patient was engaged and cooperative throughout the session using time effectively to discuss thoughts and feelings. Patient was receptive to feedback and intervention from LCSW. Patient voices continued motivation for treatment and understanding of anxiety issues. Patient is likely to benefit from future treatment because they remain motivated to decrease  Anxiety and reports benefit of regular sessions.      Interventions: Cognitive Behavioral Therapy Established psychological safety. Checked in with patient regarding their week. LCSW assisted patient in processing their emotions and thoughts regarding challenges with her mom. LCSW reviewed effective communication skills, and automatic unhelpful thoughts with patient. Provided support through active listening, validation of feelings, and highlighted patient's strengths.   Diagnosis:   ICD-10-CM   1. Adjustment disorder with anxious mood  F43.22      Plan: Patient's goal of  treatment is to be able to recognize thoughts, demand evidence for thoughts, enjoy the moment, be more optimistic and also wants to connect with this baby.    Treatment Target: Understand the relationship between thoughts, emotions, and behaviors  Psychoeducation on CBT model   Oriented the client to the therapeutic approach Teach the connection between thoughts, emotions, and behaviors  Treatment Target: Increase realistic balanced thinking -to learn how to replace thinking with thoughts that are more accurate or helpful Explore patient's thoughts, beliefs, automatic thoughts, assumptions  Identify and replace unhelpful thinking patterns (upsetting ideas, self-talk and mental images) Process distress and allow for emotional release  Questioning and challenging thoughts Cognitive reappraisal  Restructuring, Socratic questioning  Treatment Target: Increase Contact With the Present Moment  Explore client's ability to be "in-touch" with the present moment Teach mindfulness skills  Noting or describing  Body scan meditation  Loving-Kindness meditation Anchor breathing - mindful grounding   Future Appointments  Date Time Provider [place]  12/31/2021  9:00 AM Milton Ferguson, LCSW [facility] None  01/21/2022  2:00 PM [facility] [place]    Milton Ferguson, LCSW

## 2021-12-26 DIAGNOSIS — Q7962 Hypermobile Ehlers-Danlos syndrome: Secondary | ICD-10-CM | POA: Diagnosis not present

## 2021-12-31 ENCOUNTER — Ambulatory Visit: Payer: Medicaid Other | Admitting: Licensed Clinical Social Worker

## 2021-12-31 DIAGNOSIS — F4322 Adjustment disorder with anxiety: Secondary | ICD-10-CM

## 2021-12-31 NOTE — Progress Notes (Signed)
Counselor/Therapist Progress Note  Patient ID: Sherry Salas, MRN: [id number],    Date: 12/31/2021  Time Spent: 45 minutes    Treatment Type: Psychotherapy  Reported Symptoms:  Anxious, increased emotions, crying spells, denies feeling depressed   Mental Status Exam:  Appearance:   Casual     Behavior:  Appropriate, Sharing, and Motivated  Motor:  Normal  Speech/Language:   Clear and Coherent and Normal Rate  Affect:  Appropriate, Congruent, Inappropriate, and Full Range  Mood:  normal  Thought process:  normal  Thought content:    WNL  Sensory/Perceptual disturbances:    WNL  Orientation:  oriented to person, place, time/date, situation, and day of week  Attention:  Good  Concentration:  Good  Memory:  WNL  Fund of knowledge:   Good  Insight:    Good  Judgment:   Good  Impulse Control:  Good   Risk Assessment: Danger to Self:  No Self-injurious Behavior: No Danger to Others: No Duty to Warn:no Physical Aggression / Violence:No  Access to Firearms a concern: No  Gang Involvement:No   Subjective: Patient was receptive to feedback and intervention from LCSW and actively and effectively participated throughout the session. She reports increased emotionality- crying spells, easily crying and being overwhelmed at times. Patient is likely to benefit from future treatment because she continues to be motivated to decrease anxiety and reports benefit of regular sessions in addressing these symptoms.    Interventions: Cognitive Behavioral Therapy and Client Centered  Established psychological safety. Checked in with patient regarding her week. Reviewed previous session regarding challenges in relationship with her mom and increasing communication. LCSW assisted patient in processing current psychosocial stressors. Reviewed mindfulness strategies and dive reflex response for calming distress (ice pack on forehead/brow). Provided support through active listening, validation of  feelings, and highlighted patient's strengths.   Diagnosis:   ICD-10-CM   1. Adjustment disorder with anxious mood  F43.22      Plan: Patient's goal of treatment is to be able to recognize thoughts, demand evidence for thoughts, enjoy the moment, be more optimistic and also wants to connect with this baby.    Treatment Target: Understand the relationship between thoughts, emotions, and behaviors  Psychoeducation on CBT model   Oriented the client to the therapeutic approach Teach the connection between thoughts, emotions, and behaviors  Treatment Target: Increase realistic balanced thinking -to learn how to replace thinking with thoughts that are more accurate or helpful Explore patient's thoughts, beliefs, automatic thoughts, assumptions  Identify and replace unhelpful thinking patterns (upsetting ideas, self-talk and mental images) Process distress and allow for emotional release  Questioning and challenging thoughts Cognitive reappraisal  Restructuring, Socratic questioning  Treatment Target: Increase Contact With the Present Moment  Explore client's ability to be "in-touch" with the present moment Teach mindfulness skills  Noting or describing  Body scan meditation  Loving-Kindness meditation Anchor breathing - mindful grounding   Future Appointments  Date Time Provider [place]  01/14/2022  9:00 AM Milton Ferguson, LCSW [facility] None  01/21/2022  2:00 PM [facility] [place]    Milton Ferguson, LCSW

## 2022-01-02 DIAGNOSIS — Z23 Encounter for immunization: Secondary | ICD-10-CM | POA: Diagnosis not present

## 2022-01-14 ENCOUNTER — Ambulatory Visit: Payer: Medicaid Other | Admitting: Licensed Clinical Social Worker

## 2022-01-14 DIAGNOSIS — F4322 Adjustment disorder with anxiety: Secondary | ICD-10-CM

## 2022-01-14 NOTE — Progress Notes (Signed)
Counselor/Therapist Progress Note  Patient ID: Sherry Salas, MRN: [id number],    Date: 01/14/2022  Time Spent: 47 minutes    Treatment Type: Psychotherapy  Reported Symptoms:  mild anxiety, anxious thoughts   Mental Status Exam:  Appearance:   Casual and Well Groomed     Behavior:  Appropriate, Sharing, and Motivated  Motor:  Normal  Speech/Language:   Clear and Coherent and Normal Rate  Affect:  Appropriate, Congruent, and Full Range  Mood:  normal  Thought process:  normal  Thought content:    WNL  Sensory/Perceptual disturbances:    WNL  Orientation:  oriented to person, place, time/date, and situation  Attention:  Good  Concentration:  Good  Memory:  WNL  Fund of knowledge:   Good  Insight:    Good  Judgment:   Good  Impulse Control:  Good   Risk Assessment: Danger to Self:  No Self-injurious Behavior: No Danger to Others: No Duty to Warn:no Physical Aggression / Violence:No  Access to Firearms a concern: No  Gang Involvement:No   Subjective: Patient was engaged and cooperative throughout the session using time effectively to discuss thoughts and feelings. Patient voices continued motivation for treatment. Patient reports benefit of regular sessions in addressing anxiety symptoms.    Interventions: Cognitive Behavioral Therapy and Mindfulness Meditation Established psychological safety.  Checked in with patient. Engaged patient in processing current psychosocial stressors related to financial stressors, mild challenges in relationship, anxiety related to delivery. LCSW engaged patient in discussing worries related to delivery, assisted patient in identifying resources, began developing birth plan, and discussed focusing on what she does know vs. Things that are unknown. Provided support through active listening, validation of feelings, and highlighted patient's strengths.   Diagnosis:   ICD-10-CM   1. Adjustment disorder with anxious mood  F43.22      Plan:  Patient's goal of treatment is to be able to recognize thoughts, demand evidence for thoughts, enjoy the moment, be more optimistic and also wants to connect with this baby.    Treatment Target: Understand the relationship between thoughts, emotions, and behaviors  Psychoeducation on CBT model   Oriented the client to the therapeutic approach Teach the connection between thoughts, emotions, and behaviors  Treatment Target: Increase realistic balanced thinking -to learn how to replace thinking with thoughts that are more accurate or helpful Explore patient's thoughts, beliefs, automatic thoughts, assumptions  Identify and replace unhelpful thinking patterns (upsetting ideas, self-talk and mental images) Process distress and allow for emotional release  Questioning and challenging thoughts Cognitive reappraisal  Restructuring, Socratic questioning  Treatment Target: Increase Contact With the Present Moment  Explore client's ability to be "in-touch" with the present moment Teach mindfulness skills  Noting or describing  Body scan meditation  Loving-Kindness meditation Anchor breathing - mindful grounding   Future Appointments  Date Time Provider [place]  01/21/2022 11:00 AM Milton Ferguson, LCSW [facility] None  01/21/2022  2:00 PM [facility] [place]    Milton Ferguson, LCSW

## 2022-01-16 DIAGNOSIS — Q7962 Hypermobile Ehlers-Danlos syndrome: Secondary | ICD-10-CM | POA: Diagnosis not present

## 2022-01-20 ENCOUNTER — Other Ambulatory Visit: Payer: Self-pay

## 2022-01-20 DIAGNOSIS — Q796 Ehlers-Danlos syndrome, unspecified: Secondary | ICD-10-CM

## 2022-01-20 DIAGNOSIS — O09522 Supervision of elderly multigravida, second trimester: Secondary | ICD-10-CM

## 2022-01-21 ENCOUNTER — Other Ambulatory Visit: Payer: Self-pay

## 2022-01-21 ENCOUNTER — Ambulatory Visit: Payer: Medicaid Other | Attending: Obstetrics

## 2022-01-21 ENCOUNTER — Ambulatory Visit: Payer: Medicaid Other | Admitting: Licensed Clinical Social Worker

## 2022-01-21 DIAGNOSIS — F4322 Adjustment disorder with anxiety: Secondary | ICD-10-CM

## 2022-01-21 DIAGNOSIS — O99513 Diseases of the respiratory system complicating pregnancy, third trimester: Secondary | ICD-10-CM | POA: Diagnosis not present

## 2022-01-21 DIAGNOSIS — Q796 Ehlers-Danlos syndrome, unspecified: Secondary | ICD-10-CM

## 2022-01-21 DIAGNOSIS — O2690 Pregnancy related conditions, unspecified, unspecified trimester: Secondary | ICD-10-CM | POA: Insufficient documentation

## 2022-01-21 DIAGNOSIS — J45909 Unspecified asthma, uncomplicated: Secondary | ICD-10-CM | POA: Diagnosis not present

## 2022-01-21 DIAGNOSIS — Z3A32 32 weeks gestation of pregnancy: Secondary | ICD-10-CM | POA: Diagnosis not present

## 2022-01-21 DIAGNOSIS — O09522 Supervision of elderly multigravida, second trimester: Secondary | ICD-10-CM | POA: Diagnosis not present

## 2022-01-21 DIAGNOSIS — O99891 Other specified diseases and conditions complicating pregnancy: Secondary | ICD-10-CM

## 2022-01-21 NOTE — Progress Notes (Signed)
Counselor/Therapist Progress Note  Patient ID: Sherry Salas, MRN: [id number],    Date: 01/21/2022  Time Spent: 45 minutes   Treatment Type: Psychotherapy  Reported Symptoms:  Anxiety, anxious thoughts  Mental Status Exam:  Appearance:   Neat     Behavior:  Appropriate, Sharing, and Motivated  Motor:  Normal  Speech/Language:   Clear and Coherent and Normal Rate  Affect:  Appropriate, Congruent, and Full Range  Mood:  normal  Thought process:  normal  Thought content:    WNL  Sensory/Perceptual disturbances:    WNL  Orientation:  oriented to person, place, time/date, and situation  Attention:  Good  Concentration:  Good  Memory:  WNL  Fund of knowledge:   Good  Insight:    Fair  Judgment:   Fair  Impulse Control:  Fair   Risk Assessment: Danger to Self:  No Self-injurious Behavior: No Danger to Others: No Duty to Warn:no Physical Aggression / Violence:No  Access to Firearms a concern: No  Gang Involvement:No   Subjective: Patient was receptive to feedback and intervention from LCSW and actively and effectively participated throughout the session discussing thoughts and feelings. Patient is likely to benefit from future treatment because she remains motivated to manage anxiety symptoms and reports benefit of regular sessions in addressing these symptoms.    Interventions: Cognitive Behavioral Therapy and Client centered Established psychological safety. Checked in with patient regarding their week. LCSW assisted patient in processing their thoughts and emotions about challenges related to interactions with health system. LCSW validated patient's feelings and explored origins of these feelings. LCSW and patient discussed options for patient to refocus thoughts on positive experiences also experienced with medical system and options for her to have her concerns heard. Provided support through active listening, validation of feelings, and highlighted patient's strengths.    Diagnosis:   ICD-10-CM   1. Adjustment disorder with anxious mood  F43.22      Plan: Patient's goal of treatment is to be able to recognize thoughts, demand evidence for thoughts, enjoy the moment, be more optimistic and also wants to connect with this baby.    Treatment Target: Understand the relationship between thoughts, emotions, and behaviors  Psychoeducation on CBT model   Oriented the client to the therapeutic approach Teach the connection between thoughts, emotions, and behaviors  Treatment Target: Increase realistic balanced thinking -to learn how to replace thinking with thoughts that are more accurate or helpful Explore patient's thoughts, beliefs, automatic thoughts, assumptions  Identify and replace unhelpful thinking patterns (upsetting ideas, self-talk and mental images) Process distress and allow for emotional release  Questioning and challenging thoughts Cognitive reappraisal  Restructuring, Socratic questioning  Treatment Target: Increase Contact With the Present Moment  Explore client's ability to be "in-touch" with the present moment Teach mindfulness skills  Noting or describing  Body scan meditation  Loving-Kindness meditation Anchor breathing - mindful grounding   Future Appointments  Date Time Provider [place]  01/21/2022  2:00 PM [facility] [place]    Milton Ferguson, LCSW

## 2022-01-29 DIAGNOSIS — O169 Unspecified maternal hypertension, unspecified trimester: Secondary | ICD-10-CM | POA: Diagnosis not present

## 2022-02-04 ENCOUNTER — Ambulatory Visit: Payer: Medicaid Other | Admitting: Licensed Clinical Social Worker

## 2022-02-04 DIAGNOSIS — F4322 Adjustment disorder with anxiety: Secondary | ICD-10-CM

## 2022-02-04 NOTE — Progress Notes (Signed)
Counselor/Therapist Progress Note  Patient ID: Sherry Salas, MRN: [id number],    Date: 02/04/2022  Time Spent: 47 minutes    Treatment Type: Psychotherapy  Reported Symptoms:  Anxiety, anxious thoughts  Mental Status Exam:  Appearance:   Casual and Well Groomed     Behavior:  Appropriate, Sharing, and Motivated  Motor:  Normal  Speech/Language:   Clear and Coherent and Normal Rate  Affect:  Appropriate, Congruent, and Full Range  Mood:  normal  Thought process:  normal  Thought content:    WNL  Sensory/Perceptual disturbances:    WNL  Orientation:  oriented to person, place, time/date, and situation  Attention:  Good  Concentration:  Good  Memory:  WNL  Fund of knowledge:   Good  Insight:    Good  Judgment:   Good  Impulse Control:  Good   Risk Assessment: Danger to Self:  No Self-injurious Behavior: No Danger to Others: No Duty to Warn:no Physical Aggression / Violence:No  Access to Firearms a concern: No  Gang Involvement:No   Subjective: Patient was engaged and cooperative throughout the session using time effectively to discuss thoughts and feelings. Patient voices continued anxiety symptoms related to upcoming delivery and parenthood. Patient remains motivated for treatment and reports benefit of regular sessions in addressing these symptoms.    Interventions: Cognitive Behavioral Therapy Established psychological safety. Checked in with patient regarding their week. LCSW assisted patient in processing their thoughts and emotions related to reflecting on family dynamics and what kind of family she wants to create. LCSW reviewed mindful breathing exercises and co-regulating with her pets. Provided support through active listening, validation of feelings, and highlighted patient's strengths.   Diagnosis:   ICD-10-CM   1. Adjustment disorder with anxious mood  F43.22      Plan: Patient's goal of treatment is to be able to recognize thoughts, demand evidence  for thoughts, enjoy the moment, be more optimistic and also wants to connect with this baby.    Treatment Target: Understand the relationship between thoughts, emotions, and behaviors  Psychoeducation on CBT model   Oriented the client to the therapeutic approach Teach the connection between thoughts, emotions, and behaviors  Treatment Target: Increase realistic balanced thinking -to learn how to replace thinking with thoughts that are more accurate or helpful Explore patient's thoughts, beliefs, automatic thoughts, assumptions  Identify and replace unhelpful thinking patterns (upsetting ideas, self-talk and mental images) Process distress and allow for emotional release  Questioning and challenging thoughts Cognitive reappraisal  Restructuring, Socratic questioning  Treatment Target: Increase Contact With the Present Moment  Explore client's ability to be "in-touch" with the present moment Teach mindfulness skills  Noting or describing  Body scan meditation  Loving-Kindness meditation Anchor breathing - mindful grounding   Future Appointments  Date Time Provider [place]  02/18/2022  9:00 AM Milton Ferguson, LCSW [facility] None  02/18/2022  4:00 PM [facility] [place]    Milton Ferguson, LCSW

## 2022-02-13 ENCOUNTER — Other Ambulatory Visit: Payer: Self-pay

## 2022-02-13 DIAGNOSIS — Q796 Ehlers-Danlos syndrome, unspecified: Secondary | ICD-10-CM

## 2022-02-13 DIAGNOSIS — J45909 Unspecified asthma, uncomplicated: Secondary | ICD-10-CM

## 2022-02-17 DIAGNOSIS — O0993 Supervision of high risk pregnancy, unspecified, third trimester: Secondary | ICD-10-CM | POA: Diagnosis not present

## 2022-02-17 LAB — OB RESULTS CONSOLE GC/CHLAMYDIA
Chlamydia: NEGATIVE
Neisseria Gonorrhea: NEGATIVE

## 2022-02-17 LAB — OB RESULTS CONSOLE GBS: GBS: NEGATIVE

## 2022-02-17 LAB — OB RESULTS CONSOLE HIV ANTIBODY (ROUTINE TESTING): HIV: NONREACTIVE

## 2022-02-18 ENCOUNTER — Ambulatory Visit: Payer: Medicaid Other | Attending: Maternal & Fetal Medicine

## 2022-02-18 ENCOUNTER — Ambulatory Visit: Payer: Medicaid Other | Admitting: Licensed Clinical Social Worker

## 2022-02-18 ENCOUNTER — Other Ambulatory Visit: Payer: Self-pay

## 2022-02-18 DIAGNOSIS — O99891 Other specified diseases and conditions complicating pregnancy: Secondary | ICD-10-CM | POA: Diagnosis not present

## 2022-02-18 DIAGNOSIS — O99513 Diseases of the respiratory system complicating pregnancy, third trimester: Secondary | ICD-10-CM | POA: Insufficient documentation

## 2022-02-18 DIAGNOSIS — J45909 Unspecified asthma, uncomplicated: Secondary | ICD-10-CM | POA: Diagnosis not present

## 2022-02-18 DIAGNOSIS — Z3A36 36 weeks gestation of pregnancy: Secondary | ICD-10-CM | POA: Diagnosis not present

## 2022-02-18 DIAGNOSIS — O99333 Smoking (tobacco) complicating pregnancy, third trimester: Secondary | ICD-10-CM

## 2022-02-18 DIAGNOSIS — Z87891 Personal history of nicotine dependence: Secondary | ICD-10-CM

## 2022-02-18 DIAGNOSIS — F4322 Adjustment disorder with anxiety: Secondary | ICD-10-CM

## 2022-02-18 DIAGNOSIS — Q796 Ehlers-Danlos syndrome, unspecified: Secondary | ICD-10-CM | POA: Diagnosis not present

## 2022-02-18 NOTE — Progress Notes (Signed)
Counselor/Therapist Progress Note  Patient IDAngelli Salas, MRN: [id number],    Date: 02/18/2022  Time Spent: 45 minutes   Treatment Type: Individual Therapy  Reported Symptoms:  "okay" mood, mild anxiety, physical pain  Mental Status Exam:  Appearance:   Casual and Well Groomed     Behavior:  Appropriate, Sharing, and Motivated  Motor:  Normal  Speech/Language:   Clear and Coherent and Normal Rate  Affect:  Appropriate, Congruent, and Full Range  Mood:  normal  Thought process:  normal  Thought content:    WNL  Sensory/Perceptual disturbances:    WNL  Orientation:  oriented to person, place, time/date, situation, and day of week  Attention:  Good  Concentration:  Good  Memory:  WNL  Fund of knowledge:   Good  Insight:    Good  Judgment:   Good  Impulse Control:  Good   Risk Assessment: Danger to Self:  No Self-injurious Behavior: No Danger to Others: No Duty to Warn:no Physical Aggression / Violence:No  Access to Firearms a concern: No  Gang Involvement:No   Subjective: Patient was engaged and cooperative throughout the session using time effectively to discuss thoughts and feelings. Patient voices continued motivation for treatment and understanding of addiction issues.  Patient is likely to benefit from future treatment because she remains motivated to manage symptoms and reports benefit of regular sessions.   Interventions: Cognitive Behavioral Therapy Established psychological safety. Check-in with patient about her week. Engaged patient in processing current psychosocial stressors, mild anxiety and mood symptoms related to physical pain and upcoming holidays. Discussed recovery and relapse prevention. Reviewed strategies such as painting that help sooth and manage patient's symptoms. Encouraged patient to seek AA support, if needed in the future. Provided support through active listening, validation of feelings, and highlighted patient's strengths.    Diagnosis:   ICD-10-CM   1. Adjustment disorder with anxious mood  F43.22      Plan:  Patient's goal of treatment is to be able to recognize thoughts, demand evidence for thoughts, enjoy the moment, be more optimistic and also wants to connect with this baby.    Treatment Target: Understand the relationship between thoughts, emotions, and behaviors  Psychoeducation on CBT model   Oriented the client to the therapeutic approach Teach the connection between thoughts, emotions, and behaviors  Treatment Target: Increase realistic balanced thinking -to learn how to replace thinking with thoughts that are more accurate or helpful Explore patient's thoughts, beliefs, automatic thoughts, assumptions  Identify and replace unhelpful thinking patterns (upsetting ideas, self-talk and mental images) Process distress and allow for emotional release  Questioning and challenging thoughts Cognitive reappraisal  Restructuring, Socratic questioning  Treatment Target: Increase Contact With the Present Moment  Explore client's ability to be "in-touch" with the present moment Teach mindfulness skills  Noting or describing  Body scan meditation  Loving-Kindness meditation Anchor breathing - mindful grounding   Future Appointments  Date Time Provider Department Center  02/18/2022  4:00 PM [facility]  03/03/2022  9:00 AM Kathreen Cosier, LCSW [facility] None   Kathreen Cosier, LCSW

## 2022-02-26 DIAGNOSIS — O0993 Supervision of high risk pregnancy, unspecified, third trimester: Secondary | ICD-10-CM | POA: Diagnosis not present

## 2022-02-26 DIAGNOSIS — O99119 Other diseases of the blood and blood-forming organs and certain disorders involving the immune mechanism complicating pregnancy, unspecified trimester: Secondary | ICD-10-CM | POA: Diagnosis not present

## 2022-02-26 DIAGNOSIS — D696 Thrombocytopenia, unspecified: Secondary | ICD-10-CM | POA: Diagnosis not present

## 2022-03-03 ENCOUNTER — Ambulatory Visit: Payer: Medicaid Other | Admitting: Licensed Clinical Social Worker

## 2022-03-03 DIAGNOSIS — F4322 Adjustment disorder with anxiety: Secondary | ICD-10-CM

## 2022-03-03 NOTE — Progress Notes (Signed)
Counselor/Therapist Progress Note  Patient ID: Michala Deblanc, MRN: [id number],    Date: 03/03/2022  Time Spent: 45 minutes    Treatment Type: Psychotherapy  Reported Symptoms:  Anxiety, anxious thoughts, increased emotions, intermittent low mood and sadness; physical aches and pains   Mental Status Exam:  Appearance:   Casual, Neat, and Well Groomed     Behavior:  Appropriate and Sharing  Motor:  Normal  Speech/Language:   Clear and Coherent and Normal Rate  Affect:  Appropriate, Congruent, and Full Range  Mood:  normal  Thought process:  normal  Thought content:    WNL  Sensory/Perceptual disturbances:    WNL  Orientation:  oriented to person, place, time/date, situation, and day of week  Attention:  Good  Concentration:  Good  Memory:  WNL  Fund of knowledge:   Good  Insight:    Good  Judgment:   Good  Impulse Control:  Good   Risk Assessment: Danger to Self:  No Self-injurious Behavior: No Danger to Others: No Duty to Warn:no Physical Aggression / Violence:No  Access to Firearms a concern: No  Gang Involvement:No   Subjective: Patient was engaged and cooperative throughout the session using time effectively to discuss thoughts and feelings related to upcoming delivery and postpartum plans. Patient was receptive to feedback and intervention from LCSW. Patient voices continued anxiety and increased emotionality as delivery approaches and has experienced health challenges. Patient is likely to benefit from future treatment because they remain motivated to decrease symptoms and reports benefit of regular sessions.      Interventions: Cognitive Behavioral Therapy and Client centered, Psychoeducation Established psychological safety. Checked in with patient regarding their week. LCSW engaged patient in processing their thoughts and emotions about upcoming delivery, health challenges, and postpartum plans. LCSW validated patient's experiences, explored coping methods -  engaging with others/mom; provided psychoeducation on postpartum mood and anxiety issues and tips for caring for herself, including sleep, regular eating, and support from others.   Diagnosis:   ICD-10-CM   1. Adjustment disorder with anxious mood  F43.22      Plan:  Patient's goal of treatment is to be able to recognize thoughts, demand evidence for thoughts, enjoy the moment, be more optimistic and also wants to connect with this baby.    Treatment Target: Understand the relationship between thoughts, emotions, and behaviors  Psychoeducation on CBT model   Oriented the client to the therapeutic approach Teach the connection between thoughts, emotions, and behaviors  Treatment Target: Increase realistic balanced thinking -to learn how to replace thinking with thoughts that are more accurate or helpful Explore patient's thoughts, beliefs, automatic thoughts, assumptions  Identify and replace unhelpful thinking patterns (upsetting ideas, self-talk and mental images) Process distress and allow for emotional release  Questioning and challenging thoughts Cognitive reappraisal  Restructuring, Socratic questioning  Treatment Target: Increase Contact With the Present Moment  Explore client's ability to be "in-touch" with the present moment Teach mindfulness skills  Noting or describing  Body scan meditation  Loving-Kindness meditation Anchor breathing - mindful grounding    Future Appointments  Date Time Provider Department Center  03/10/2022  9:00 AM Kathreen Cosier, LCSW [facility] None     Kathreen Cosier, LCSW

## 2022-03-10 ENCOUNTER — Ambulatory Visit: Payer: Medicaid Other | Admitting: Licensed Clinical Social Worker

## 2022-03-10 DIAGNOSIS — F4322 Adjustment disorder with anxiety: Secondary | ICD-10-CM

## 2022-03-10 NOTE — Progress Notes (Signed)
Counselor/Therapist Progress Note  Patient ID: Sherry Salas, MRN: [id number],    Date: 03/10/2022  Time Spent: 42 minutes    Treatment Type: Psychotherapy  Reported Symptoms:  Anxiety, anxious thoughts and worries, overwhelmed   Mental Status Exam:  Appearance:   Casual, Neat, and Well Groomed     Behavior:  Appropriate, Sharing, and Motivated  Motor:  Normal  Speech/Language:   Clear and Coherent and Normal Rate  Affect:  Appropriate, Congruent, and Full Range  Mood:  normal  Thought process:  goal directed  Thought content:    WNL  Sensory/Perceptual disturbances:    WNL  Orientation:  oriented to person, place, time/date, and situation  Attention:  Good  Concentration:  Good  Memory:  WNL  Fund of knowledge:   Good  Insight:    Fair  Judgment:   Fair  Impulse Control:  Fair   Risk Assessment: Danger to Self:  No Self-injurious Behavior: No Danger to Others: No Duty to Warn:no Physical Aggression / Violence:No  Access to Firearms a concern: No  Gang Involvement:No   Subjective: Patient was engaged and cooperative throughout the session using time effectively to discuss thoughts and feelings. Patient voices increased anxiety mixed with feelings of excitement about up coming delivery and parenthood. Patient was receptive to feedback and intervention and voices continued motivation for treatment.  Interventions: Cognitive Behavioral Therapy and Client centered Established psychological safety. Checked in with client about her mood and psychosocial stressors, increased anxiety due to upcoming delivery. Processed thoughts and emotions about upcoming delivery and transition to parenthood. Validated patient's feelings of anxiety and worry. Provided psychoeducation on perinatal mood and anxiety disorders. Provided PSI information. Patient is scheduled for an induction date of the 22nd, if she doesn't come sooner. Provided support through active listening, validation of  feelings, and highlighted patient's strengths.   Diagnosis:   ICD-10-CM   1. Adjustment disorder with anxious mood  F43.22      Plan:  Patient's goal of treatment is to be able to recognize thoughts, demand evidence for thoughts, enjoy the moment, be more optimistic and also wants to connect with this baby.    Treatment Target: Understand the relationship between thoughts, emotions, and behaviors  Psychoeducation on CBT model   Oriented the client to the therapeutic approach Teach the connection between thoughts, emotions, and behaviors  Treatment Target: Increase realistic balanced thinking -to learn how to replace thinking with thoughts that are more accurate or helpful Explore patient's thoughts, beliefs, automatic thoughts, assumptions  Identify and replace unhelpful thinking patterns (upsetting ideas, self-talk and mental images) Process distress and allow for emotional release  Questioning and challenging thoughts Cognitive reappraisal  Restructuring, Socratic questioning  Treatment Target: Increase Contact With the Present Moment  Explore client's ability to be "in-touch" with the present moment Teach mindfulness skills  Noting or describing  Body scan meditation  Loving-Kindness meditation Anchor breathing - mindful grounding   No future appointments. LCSW schedule is not in Epic for 04/01/2022  Kathreen Cosier, LCSW

## 2022-03-13 ENCOUNTER — Inpatient Hospital Stay: Payer: Medicaid Other | Admitting: Certified Registered Nurse Anesthetist

## 2022-03-13 ENCOUNTER — Inpatient Hospital Stay
Admission: EM | Admit: 2022-03-13 | Discharge: 2022-03-15 | DRG: 806 | Disposition: A | Discharge status: Home / Self Care | Payer: Medicaid Other | Attending: Obstetrics | Admitting: Obstetrics

## 2022-03-13 ENCOUNTER — Encounter: Payer: Self-pay | Admitting: Obstetrics and Gynecology

## 2022-03-13 ENCOUNTER — Other Ambulatory Visit: Payer: Self-pay

## 2022-03-13 DIAGNOSIS — D62 Acute posthemorrhagic anemia: Secondary | ICD-10-CM | POA: Diagnosis not present

## 2022-03-13 DIAGNOSIS — O9912 Other diseases of the blood and blood-forming organs and certain disorders involving the immune mechanism complicating childbirth: Principal | ICD-10-CM | POA: Diagnosis present

## 2022-03-13 DIAGNOSIS — D696 Thrombocytopenia, unspecified: Secondary | ICD-10-CM | POA: Diagnosis present

## 2022-03-13 DIAGNOSIS — Z87891 Personal history of nicotine dependence: Secondary | ICD-10-CM

## 2022-03-13 DIAGNOSIS — O9081 Anemia of the puerperium: Secondary | ICD-10-CM | POA: Diagnosis not present

## 2022-03-13 DIAGNOSIS — Z3A40 40 weeks gestation of pregnancy: Secondary | ICD-10-CM

## 2022-03-13 DIAGNOSIS — O26893 Other specified pregnancy related conditions, third trimester: Secondary | ICD-10-CM | POA: Diagnosis not present

## 2022-03-13 DIAGNOSIS — O479 False labor, unspecified: Secondary | ICD-10-CM | POA: Diagnosis present

## 2022-03-13 DIAGNOSIS — O480 Post-term pregnancy: Secondary | ICD-10-CM | POA: Diagnosis not present

## 2022-03-13 HISTORY — DX: Ehlers-Danlos syndrome, unspecified: Q79.60

## 2022-03-13 LAB — TYPE AND SCREEN
ABO/RH(D): O POS
Antibody Screen: NEGATIVE

## 2022-03-13 LAB — CBC
HCT: 40 % (ref 36.0–46.0)
Hemoglobin: 13.3 g/dL (ref 12.0–15.0)
MCH: 29.6 pg (ref 26.0–34.0)
MCHC: 33.3 g/dL (ref 30.0–36.0)
MCV: 89.1 fL (ref 80.0–100.0)
Platelets: 125 10*3/uL — ABNORMAL LOW (ref 150–400)
RBC: 4.49 MIL/uL (ref 3.87–5.11)
RDW: 13.2 % (ref 11.5–15.5)
WBC: 13.8 10*3/uL — ABNORMAL HIGH (ref 4.0–10.5)
nRBC: 0 % (ref 0.0–0.2)

## 2022-03-13 LAB — ABO/RH: ABO/RH(D): O POS

## 2022-03-13 MED ORDER — OXYCODONE HCL 5 MG PO TABS: 5.0000 mg | ORAL_TABLET | ORAL | Status: DC | PRN

## 2022-03-13 MED ORDER — CALCIUM CARBONATE ANTACID 500 MG PO CHEW
2.0000 | CHEWABLE_TABLET | ORAL | Status: DC | PRN
  Administered 2022-03-13: 400 mg via ORAL
  Filled 2022-03-13: qty 2

## 2022-03-13 MED ORDER — ZOLPIDEM TARTRATE 5 MG PO TABS: 5.0000 mg | ORAL_TABLET | Freq: Every evening | ORAL | Status: DC | PRN

## 2022-03-13 MED ORDER — LACTATED RINGERS IV SOLN: 500.0000 mL | INTRAVENOUS | Status: DC | PRN

## 2022-03-13 MED ORDER — PRENATAL MULTIVITAMIN CH
1.0000 | ORAL_TABLET | Freq: Every day | ORAL | Status: DC
  Administered 2022-03-14 – 2022-03-15 (×2): 1 via ORAL
  Filled 2022-03-13 (×2): qty 1

## 2022-03-13 MED ORDER — OXYTOCIN-SODIUM CHLORIDE 30-0.9 UT/500ML-% IV SOLN
2.5000 [IU]/h | INTRAVENOUS | Status: DC
  Filled 2022-03-13: qty 500

## 2022-03-13 MED ORDER — DIPHENHYDRAMINE HCL 25 MG PO CAPS: 25.0000 mg | ORAL_CAPSULE | Freq: Four times a day (QID) | ORAL | Status: DC | PRN

## 2022-03-13 MED ORDER — MISOPROSTOL 200 MCG PO TABS
ORAL_TABLET | ORAL | Status: AC
  Filled 2022-03-13: qty 4

## 2022-03-13 MED ORDER — LIDOCAINE HCL (PF) 1 % IJ SOLN
INTRAMUSCULAR | Status: DC | PRN
  Administered 2022-03-13: 3 mL

## 2022-03-13 MED ORDER — FERROUS SULFATE 325 (65 FE) MG PO TABS
325.0000 mg | ORAL_TABLET | Freq: Two times a day (BID) | ORAL | Status: DC
  Administered 2022-03-14 – 2022-03-15 (×4): 325 mg via ORAL
  Filled 2022-03-13 (×4): qty 1

## 2022-03-13 MED ORDER — SENNOSIDES-DOCUSATE SODIUM 8.6-50 MG PO TABS
2.0000 | ORAL_TABLET | Freq: Every day | ORAL | Status: DC
  Administered 2022-03-14 – 2022-03-15 (×2): 2 via ORAL
  Filled 2022-03-13 (×2): qty 2

## 2022-03-13 MED ORDER — TERBUTALINE SULFATE 1 MG/ML IJ SOLN: 0.2500 mg | Freq: Once | INTRAMUSCULAR | Status: DC | PRN

## 2022-03-13 MED ORDER — BUPIVACAINE HCL (PF) 0.25 % IJ SOLN
INTRAMUSCULAR | Status: DC | PRN
  Administered 2022-03-13 (×2): 3 mL via EPIDURAL

## 2022-03-13 MED ORDER — OXYTOCIN BOLUS FROM INFUSION
333.0000 mL | Freq: Once | INTRAVENOUS | Status: AC
  Administered 2022-03-13: 333 mL via INTRAVENOUS

## 2022-03-13 MED ORDER — OXYCODONE-ACETAMINOPHEN 5-325 MG PO TABS: 1.0000 | ORAL_TABLET | ORAL | Status: DC | PRN

## 2022-03-13 MED ORDER — LIDOCAINE HCL (PF) 1 % IJ SOLN
INTRAMUSCULAR | Status: AC
  Filled 2022-03-13: qty 30

## 2022-03-13 MED ORDER — IBUPROFEN 600 MG PO TABS
600.0000 mg | ORAL_TABLET | Freq: Four times a day (QID) | ORAL | Status: DC
  Administered 2022-03-13 – 2022-03-15 (×7): 600 mg via ORAL
  Filled 2022-03-13 (×7): qty 1

## 2022-03-13 MED ORDER — FENTANYL CITRATE (PF) 100 MCG/2ML IJ SOLN
50.0000 ug | INTRAMUSCULAR | Status: DC | PRN
  Administered 2022-03-13: 50 ug via INTRAVENOUS
  Administered 2022-03-13: 100 ug via INTRAVENOUS
  Administered 2022-03-13: 50 ug via INTRAVENOUS
  Filled 2022-03-13 (×3): qty 2

## 2022-03-13 MED ORDER — LACTATED RINGERS IV SOLN: 125.0000 mL/h | INTRAVENOUS | Status: DC

## 2022-03-13 MED ORDER — OXYCODONE-ACETAMINOPHEN 5-325 MG PO TABS: 2.0000 | ORAL_TABLET | ORAL | Status: DC | PRN

## 2022-03-13 MED ORDER — ONDANSETRON HCL 4 MG PO TABS: 4.0000 mg | ORAL_TABLET | ORAL | Status: DC | PRN

## 2022-03-13 MED ORDER — AMMONIA AROMATIC IN INHA
RESPIRATORY_TRACT | Status: AC
  Filled 2022-03-13: qty 10

## 2022-03-13 MED ORDER — LIDOCAINE HCL (PF) 1 % IJ SOLN: 30.0000 mL | INTRAMUSCULAR | Status: DC | PRN

## 2022-03-13 MED ORDER — PRENATAL MULTIVITAMIN CH: 1.0000 | ORAL_TABLET | Freq: Every day | ORAL | Status: DC

## 2022-03-13 MED ORDER — DOCUSATE SODIUM 100 MG PO CAPS: 100.0000 mg | ORAL_CAPSULE | Freq: Every day | ORAL | Status: DC

## 2022-03-13 MED ORDER — FENTANYL-BUPIVACAINE-NACL 0.5-0.125-0.9 MG/250ML-% EP SOLN
EPIDURAL | Status: AC
  Filled 2022-03-13: qty 250

## 2022-03-13 MED ORDER — COCONUT OIL OIL
1.0000 | TOPICAL_OIL | Status: DC | PRN
  Administered 2022-03-13: 1 via TOPICAL
  Filled 2022-03-13: qty 7.5

## 2022-03-13 MED ORDER — ONDANSETRON HCL 4 MG/2ML IJ SOLN: 4.0000 mg | Freq: Four times a day (QID) | INTRAMUSCULAR | Status: DC | PRN

## 2022-03-13 MED ORDER — SODIUM CHLORIDE 0.9 % IV SOLN: 250.0000 mL | INTRAVENOUS | Status: DC | PRN

## 2022-03-13 MED ORDER — OXYCODONE HCL 5 MG PO TABS: 10.0000 mg | ORAL_TABLET | ORAL | Status: DC | PRN

## 2022-03-13 MED ORDER — LACTATED RINGERS IV SOLN: INTRAVENOUS | Status: DC

## 2022-03-13 MED ORDER — BENZOCAINE-MENTHOL 20-0.5 % EX AERO
1.0000 | INHALATION_SPRAY | CUTANEOUS | Status: DC | PRN
  Administered 2022-03-13: 1 via TOPICAL
  Filled 2022-03-13: qty 56

## 2022-03-13 MED ORDER — WITCH HAZEL-GLYCERIN EX PADS
1.0000 | MEDICATED_PAD | CUTANEOUS | Status: DC | PRN
  Administered 2022-03-13: 1 via TOPICAL
  Filled 2022-03-13 (×2): qty 100

## 2022-03-13 MED ORDER — FENTANYL-BUPIVACAINE-NACL 0.5-0.125-0.9 MG/250ML-% EP SOLN
EPIDURAL | Status: DC | PRN
  Administered 2022-03-13: 12 mL/h via EPIDURAL

## 2022-03-13 MED ORDER — ACETAMINOPHEN 325 MG PO TABS
650.0000 mg | ORAL_TABLET | ORAL | Status: DC | PRN
  Administered 2022-03-13 – 2022-03-15 (×5): 650 mg via ORAL
  Filled 2022-03-13 (×4): qty 2

## 2022-03-13 MED ORDER — OXYTOCIN 10 UNIT/ML IJ SOLN
INTRAMUSCULAR | Status: AC
  Filled 2022-03-13: qty 2

## 2022-03-13 MED ORDER — OXYTOCIN-SODIUM CHLORIDE 30-0.9 UT/500ML-% IV SOLN
1.0000 m[IU]/min | INTRAVENOUS | Status: DC
  Administered 2022-03-13: 2 m[IU]/min via INTRAVENOUS

## 2022-03-13 MED ORDER — SOD CITRATE-CITRIC ACID 500-334 MG/5ML PO SOLN: 30.0000 mL | ORAL | Status: DC | PRN

## 2022-03-13 MED ORDER — SODIUM CHLORIDE 0.9% FLUSH: 3.0000 mL | INTRAVENOUS | Status: DC | PRN

## 2022-03-13 MED ORDER — SODIUM CHLORIDE 0.9% FLUSH
3.0000 mL | Freq: Two times a day (BID) | INTRAVENOUS | Status: DC
  Administered 2022-03-13: 3 mL via INTRAVENOUS

## 2022-03-13 MED ORDER — ACETAMINOPHEN 325 MG PO TABS: 650.0000 mg | ORAL_TABLET | ORAL | Status: DC | PRN

## 2022-03-13 MED ORDER — ONDANSETRON HCL 4 MG/2ML IJ SOLN: 4.0000 mg | INTRAMUSCULAR | Status: DC | PRN

## 2022-03-13 MED ORDER — TETANUS-DIPHTH-ACELL PERTUSSIS 5-2.5-18.5 LF-MCG/0.5 IM SUSY
0.5000 mL | PREFILLED_SYRINGE | Freq: Once | INTRAMUSCULAR | Status: DC
  Filled 2022-03-13: qty 0.5

## 2022-03-13 MED ORDER — DIBUCAINE (PERIANAL) 1 % EX OINT
1.0000 | TOPICAL_OINTMENT | CUTANEOUS | Status: DC | PRN
  Administered 2022-03-15: 1 via RECTAL

## 2022-03-13 MED ORDER — ACETAMINOPHEN 325 MG PO TABS
650.0000 mg | ORAL_TABLET | ORAL | Status: DC | PRN
  Filled 2022-03-13: qty 2

## 2022-03-13 MED ORDER — SIMETHICONE 80 MG PO CHEW: 80.0000 mg | CHEWABLE_TABLET | ORAL | Status: DC | PRN

## 2022-03-13 MED ORDER — LIDOCAINE-EPINEPHRINE (PF) 1.5 %-1:200000 IJ SOLN
INTRAMUSCULAR | Status: DC | PRN
  Administered 2022-03-13: 3 mL via PERINEURAL

## 2022-03-13 NOTE — Discharge Summary (Addendum)
Obstetrical Discharge Summary  Patient Name: [facility] DOB: 1985/04/19 MRN: [id number]  Date of Admission: 03/13/2022 Date of Delivery: 03/13/22 Delivered by: Sherry Salas, CNM  Date of Discharge: 03/15/22  Primary OB: [facility] [id number] last menstrual period was 06/06/2021. EDC Estimated Date of Delivery: 03/13/22 Gestational Age at Delivery: [redacted]w[redacted]d   Antepartum complications:  Sherry Salas Danlos - hypermobile type  Smokers-Vapes History of Alcoholism History of Miscarriage x2 Pap smear Neg/HPV Positive  Thrombocytopenia   Admitting Diagnosis: Uterine contractions [O47.9] Normal labor [O80, Z37.9]  Secondary Diagnosis: Patient Active Problem List   Diagnosis Date Noted   NSVD (normal spontaneous vaginal delivery) 03/13/2022   Adjustment disorder with anxious mood 09/23/2021   Hair loss 06/17/2018   Palpitations 09/21/2015   Anxiety and depression 03/20/2015    Discharge Diagnosis: Term Pregnancy Delivered      Augmentation: AROM and Pitocin Complications: None Intrapartum complications/course: see delivery note Delivery Type: spontaneous vaginal delivery Anesthesia: epidural anesthesia Placenta: spontaneous To Pathology: No  Laceration: 2nd degree and vaginal Episiotomy: none Newborn Data: Live born female  Birth Weight:  6#9 APGAR: 8, 9  Newborn Delivery   Birth date/time: 03/13/2022 19:26:00 Delivery type: Vaginal, Spontaneous      Postpartum Procedures: none Edinburgh:     03/14/2022    2:05 PM  Edinburgh Postnatal Depression Scale Screening Tool  I have been able to laugh and see the funny side of things. 0  I have looked forward with enjoyment to things. 0  I have blamed myself unnecessarily when things went wrong. 1  I have been anxious or worried for no good reason. 1  I have felt scared or panicky for no good reason. 1  Things have been getting on top of me. 1  I have been so unhappy that I have had difficulty  sleeping. 0  I have felt sad or miserable. 0  I have been so unhappy that I have been crying. 1  The thought of harming myself has occurred to me. 0  Edinburgh Postnatal Depression Scale Total 5     Post partum course:  Patient had an uncomplicated postpartum course.  By time of discharge on PPD#1, her pain was controlled on oral pain medications; she had appropriate lochia and was ambulating, voiding without difficulty and tolerating regular diet.  She was deemed stable for discharge to home.      Discharge Physical Exam:  BP 112/69 (BP Location: Right Arm)   Pulse 70   Temp 98 F (36.7 C) (Oral)   Resp 18   Ht 5' 7.01" (1.702 m)   Wt 82.1 kg   LMP 06/06/2021   SpO2 96%   Breastfeeding Unknown   BMI 28.34 kg/m   General: NAD CV: RRR Pulm: CTABL, nl effort ABD: s/nd/nt, fundus firm and below the umbilicus Lochia: moderate Perineum:minimal edema/repair well approximated DVT Evaluation: LE non-ttp, no evidence of DVT on exam.  Hemoglobin  Date Value Ref Range Status  03/14/2022 11.6 (L) 12.0 - 15.0 g/dL Final   HCT  Date Value Ref Range Status  03/14/2022 35.0 (L) 36.0 - 46.0 % Final    Risk assessment for postpartum VTE and prophylactic treatment: Very high risk factors: None High risk factors: None Moderate risk factors: Tobacco use > 1/2 PPD  Postpartum VTE prophylaxis with LMWH not indicated  Disposition: stable, discharge to home. Baby Feeding: breast feeding Baby Disposition: home with mom  Rh Immune globulin indicated: No Rubella vaccine given: was not indicated Varivax vaccine given: was  not indicated Flu vaccine given in AP setting: No Tdap vaccine given in AP setting: Yes   Contraception: NFP  Prenatal Labs:  Blood type/Rh O pos  Antibody screen neg  Rubella Immune  Varicella Immune  RPR NR  HBsAg Neg  HIV NR  GC neg  Chlamydia neg  Genetic screening negative  1 hour GTT 87  3 hour GTT    GBS neg    Plan:  Sherry Salas [facility] was  discharged to home in good condition.   Discharge Medications: Allergies as of 03/15/2022       Reactions   Sulfa Antibiotics         Medication List     TAKE these medications    acetaminophen 325 MG tablet Commonly known as: Tylenol Take 2 tablets (650 mg total) by mouth every 4 (four) hours as needed (for pain scale < 4).   benzocaine-Menthol 20-0.5 % Aero Commonly known as: DERMOPLAST Apply 1 Application topically as needed for irritation (perineal discomfort).   coconut oil Oil Apply 1 Application topically as needed.   diphenhydrAMINE 25 mg capsule Commonly known as: BENADRYL Take 1 capsule (25 mg total) by mouth every 6 (six) hours as needed for itching.   ferrous sulfate 325 (65 FE) MG tablet Take 1 tablet (325 mg total) by mouth 2 (two) times daily with a meal. What changed: when to take this   folic acid 1 MG tablet Commonly known as: FOLVITE Take 1 mg by mouth daily.   ibuprofen 600 MG tablet Commonly known as: ADVIL Take 1 tablet (600 mg total) by mouth every 6 (six) hours.   polyethylene glycol 17 g packet Commonly known as: MIRALAX / GLYCOLAX Take 17 g by mouth daily.   prenatal multivitamin Tabs tablet Take 1 tablet by mouth daily at 12 noon.   senna-docusate 8.6-50 MG tablet Commonly known as: Senokot-S Take 2 tablets by mouth daily.   simethicone 80 MG chewable tablet Commonly known as: MYLICON Chew 1 tablet (80 mg total) by mouth as needed for flatulence.   witch hazel-glycerin pad Commonly known as: TUCKS Apply 1 Application topically as needed for hemorrhoids.         Follow-up Information     Sherry Salas, CNM. Schedule an appointment as soon as possible for a visit in 6 week(s).   Specialty: Certified Nurse Midwife Contact information: [address] [place] [address] [phone]                 Signed: Chari Salas CNM

## 2022-03-13 NOTE — Discharge Summary (Signed)
Roselinda [facility] is a 36 y.o. female. She is at [redacted]w[redacted]d gestation. Patient's last menstrual period was 06/06/2021. Estimated Date of Delivery: 03/13/22  Prenatal care site: [facility]   Current pregnancy complicated by:   IOL scheduled for 03/20/22 at 0001 Lorri Frederick Danlos - hypermobile type  09/25/2021 MFM referral - completed 11/05/2021 Normal anatomy with MFM on 11/05/2021 F/U growth scheduled in 6 weeks  High risk for SAB, PTL, pPROM, precipitous labor, and perineal lacerations  Smokers-Vapes History of Alcoholism History of Miscarriage x2 Pap smear Neg/HPV Positive  09/18/2021 - Colpo with SDJ Needs Pap postpartum  Thrombocytopenia Platelet level 140 at 36 week labs 02/26/22: platelets 140   Chief complaint: uterine contractions  She reports uterine contractions since yesterday that have become stronger and closer together, now currently about 5-6 minutes apart.  S: Resting comfortably. CTX q5-54min, no VB.no LOF,  Active fetal movement.  Denies: HA, visual changes, SOB, or RUQ/epigastric pain  Maternal Medical History:   Past Medical History:  Diagnosis Date   Asthma    Chickenpox    Depression    Ehlers-Danlos syndrome    Generalized anxiety disorder     Past Surgical History:  Procedure Laterality Date   BREAST REDUCTION SURGERY      Allergies  Allergen Reactions   Sulfa Antibiotics     Prior to Admission medications   Medication Sig Start Date End Date Taking? Authorizing Provider  ferrous sulfate 325 (65 FE) MG tablet Take 325 mg by mouth daily with breakfast.    [provider]  folic acid (FOLVITE) 1 MG tablet Take 1 mg by mouth daily.    [provider]  polyethylene glycol (MIRALAX / GLYCOLAX) 17 g packet Take 17 g by mouth daily.    [provider]  Prenatal Vit-Fe Fumarate-FA (PRENATAL MULTIVITAMIN) TABS tablet Take 1 tablet by mouth daily at 12 noon.    [provider]    Social History: She  reports  that she has quit smoking. Her smoking use included cigarettes. She smoked an average of .5 packs per day. She has never used smokeless tobacco. She reports that she does not currently use alcohol after a past usage of about 10.0 standard drinks of alcohol per week. She reports that she does not use drugs.  Family History: family history includes Alcohol abuse in her maternal grandfather; Arthritis in her mother; Breast cancer in her mother; Depression in her mother; Ehlers-Danlos syndrome in her maternal aunt and mother; Lung cancer in her maternal grandfather; Stroke in her mother.  no history of gyn cancers  Review of Systems: A full review of systems was performed and negative except as noted in the HPI.     O:  BP 114/88 (BP Location: Left Arm)   Pulse 68   Temp 97.9 F (36.6 C) (Oral)   Resp 18   Ht 5' 7.01" (1.702 m)   Wt 82.1 kg   LMP 06/06/2021   BMI 28.34 kg/m  No results found for this or any previous visit (from the past 48 hour(s)).   Constitutional: NAD, AAOx3  HE/ENT: extraocular movements grossly intact, moist mucous membranes CV: RRR PULM: nl respiratory effort, CTABL     Abd: gravid, non-tender, non-distended, soft      Ext: Non-tender, Nonedematous   Psych: mood appropriate, speech normal Pelvic: per RN exam - 3/60/-1  Fetal  monitoring: Cat 1 Appropriate for GA Baseline: 125bpm Variability: moderate Accelerations: present x >2 Decelerations absent  A/P: 36 y.o.  [redacted]w[redacted]d here for antenatal surveillance for uterine contractions  Principle Diagnosis:  High risk pregnancy in third trimester, false labor  Labor: not present. Cervix unchanged after 2 hours of monitoring Fetal Wellbeing: Reassuring Cat 1 tracing. Reactive NST  D/c home stable, precautions reviewed, follow-up as scheduled.    Janyce Llanos, CNM 03/13/2022 7:14 AM

## 2022-03-13 NOTE — OB Triage Note (Signed)
Pt is a G4 P0 with c/o ctx's which started at around 0130 x 4 minutes apart and have increased in intensity. Since that time.

## 2022-03-13 NOTE — Anesthesia Procedure Notes (Signed)
Epidural Patient location during procedure: OB Start time: 03/13/2022 4:04 PM End time: 03/13/2022 4:34 PM  Staffing Anesthesiologist: Corinda Gubler, MD Resident/CRNA: Ginger Carne, CRNA Performed: resident/CRNA   Preanesthetic Checklist Completed: patient identified, IV checked, site marked, risks and benefits discussed, surgical consent, monitors and equipment checked, pre-op evaluation and timeout performed  Epidural Patient position: sitting Prep: ChloraPrep Patient monitoring: heart rate, continuous pulse ox and blood pressure Approach: midline Location: L3-L4 Injection technique: LOR saline  Needle:  Needle type: Tuohy  Needle gauge: 17 G Needle length: 9 cm and 9 Needle insertion depth: 7.5 cm Catheter type: closed end flexible Catheter size: 19 Gauge Catheter at skin depth: 11 cm Test dose: negative and 1.5% lidocaine with Epi 1:200 K  Assessment Sensory level: T10 Events: blood not aspirated, no cerebrospinal fluid, injection not painful, no injection resistance, no paresthesia and negative IV test  Additional Notes 1 attempt Pt. Evaluated and documentation done after procedure finished. Patient identified. Risks/Benefits/Options discussed with patient including but not limited to bleeding, infection, nerve damage, paralysis, failed block, incomplete pain control, headache, blood pressure changes, nausea, vomiting, reactions to medication both or allergic, itching and postpartum back pain. Confirmed with bedside nurse the patient's most recent platelet count. Confirmed with patient that they are not currently taking any anticoagulation, have any bleeding history or any family history of bleeding disorders. Patient expressed understanding and wished to proceed. All questions were answered. Sterile technique was used throughout the entire procedure. Please see nursing notes for vital signs. Test dose was given through epidural catheter and negative prior to  continuing to dose epidural or start infusion. Warning signs of high block given to the patient including shortness of breath, tingling/numbness in hands, complete motor block, or any concerning symptoms with instructions to call for help. Patient was given instructions on fall risk and not to get out of bed. All questions and concerns addressed with instructions to call with any issues or inadequate analgesia.    Patient tolerated the insertion well without immediate complications.Reason for block:procedure for pain

## 2022-03-13 NOTE — Progress Notes (Signed)
Labor Progress Note  Sherry Salas is a 36 y.o. G4P0030 at [redacted]w[redacted]d by LMP admitted for active labor  Subjective: Pt is coping well with UCs, Has had a couple of doses of Fentanyl   Objective: BP 114/88 (BP Location: Left Arm)   Pulse 68   Temp 97.9 F (36.6 C) (Oral)   Resp 18   Ht 5' 7.01" (1.702 m)   Wt 82.1 kg   LMP 06/06/2021   BMI 28.34 kg/m   Fetal Assessment: FHT:  FHR: 135 bpm, variability: moderate,  accelerations:  Present,  decelerations:  Absent Category/reactivity:  Category I UC:   regular, every 3-5 minutes SVE:    Dilation: 4.5cm  Effacement: 90%  Station:  -1  Consistency: ---  Position: ---  Membrane status: Intact Amniotic color: N/A  Labs: Lab Results  Component Value Date   WBC 13.8 (H) 03/13/2022   HGB 13.3 03/13/2022   HCT 40.0 03/13/2022   MCV 89.1 03/13/2022   PLT 125 (L) 03/13/2022    Assessment / Plan: Spontaneous labor, progressing slowly If no change in 2 hours will consider starting Pitocin and/or AROM  Labor: Progressing normally Preeclampsia:   114/88 Fetal Wellbeing:  Category I Pain Control:  Labor support without medications and IV pain meds I/D:   Afebrile, Intact, GBS neg Anticipated MOD:  NSVD  Cyril Mourning, CNM 03/13/2022, 2:16 PM

## 2022-03-13 NOTE — H&P (Signed)
OB History & Physical   History of Present Illness:  Chief Complaint:   HPI:  Sherry Salas is a 36 y.o. G40P0030 female at [redacted]w[redacted]d dated by LMP.  She presents to L&D for active labor  She reports:  -active fetal movement -no leakage of fluid  -no vaginal bleeding -onset of contractions at 0130 currently every 2-7 minutes  Pregnancy Issues: 1. IOL scheduled for 03/20/22 at 0001 JO 2. Ehlers Danlos - hypermobile type  3. Smokers-Vapes 4. History of Alcoholism 5. History of Miscarriage x2 6. Pap smear Neg/HPV Positive  7. Thrombocytopenia   Maternal Medical History:   Past Medical History:  Diagnosis Date   Asthma    Chickenpox    Depression    Ehlers-Danlos syndrome    Generalized anxiety disorder     Past Surgical History:  Procedure Laterality Date   BREAST REDUCTION SURGERY      Allergies  Allergen Reactions   Sulfa Antibiotics     Prior to Admission medications   Medication Sig Start Date End Date Taking? Authorizing Provider  ferrous sulfate 325 (65 FE) MG tablet Take 325 mg by mouth daily with breakfast.    [provider]  folic acid (FOLVITE) 1 MG tablet Take 1 mg by mouth daily.    [provider]  polyethylene glycol (MIRALAX / GLYCOLAX) 17 g packet Take 17 g by mouth daily.    [provider]  Prenatal Vit-Fe Fumarate-FA (PRENATAL MULTIVITAMIN) TABS tablet Take 1 tablet by mouth daily at 12 noon.    [provider]     [facility]renatal care [facility]: [facility]   Social History: She  reports that she has quit smoking. Her smoking use included cigarettes. She smoked an average of .5 packs per day. She has never used smokeless tobacco. She reports that she does not currently use alcohol after a past usage of about 10.0 standard drinks of alcohol per week. She reports that she does not use drugs.  Family History: family history includes Alcohol abuse in her maternal grandfather; Arthritis in her mother; Breast  cancer in her mother; Depression in her mother; Ehlers-Danlos syndrome in her maternal aunt and mother; Lung cancer in her maternal grandfather; Stroke in her mother.   Review of Systems: A full review of systems was performed and negative except as noted in the HPI.    Physical Exam:  Vital Signs: BP 114/88 (BP Location: Left Arm)   Pulse 68   Temp 97.9 F (36.6 C) (Oral)   Resp 18   Ht 5' 7.01" (1.702 m)   Wt 82.1 kg   LMP 06/06/2021   BMI 28.34 kg/m   General:   alert and cooperative  Skin:  normal  Neurologic:    Alert & oriented x 3  Lungs:    Nl effort  Heart:   regular rate and rhythm  Abdomen:  soft, non-tender; bowel sounds normal; no masses,  no organomegaly  Extremities: : non-tender, symmetric, no edema bilaterally.      EFW: 02/26/22 3048  gm    6 lb 12 oz      58  %   No results found for this or any previous visit (from the past 24 hour(s)).  Pertinent Results:  Prenatal Labs: Blood type/Rh O pos  Antibody screen neg  Rubella Immune  Varicella Immune  RPR NR  HBsAg Neg  HIV NR  GC neg  Chlamydia neg  Genetic screening negative  1 hour GTT 87  3 hour  GTT   GBS neg   FHT: FHR: 125 bpm, variability: moderate,  accelerations:  Present,  decelerations:  Absent Category/reactivity:  Category I TOCO: regular, every 5-6 minutes SVE: Dilation: 4 / Effacement (%): 90 / Station: -1      [place] MFM OB FOLLOW UP  Result Date: 02/26/2022 ----------------------------------------------------------------------  OBSTETRICS REPORT                       (Signed Final 02/26/2022 08:31 am) ---------------------------------------------------------------------- Patient Info  ID #:       [id number]                          D.O.B.:  07-29-1985 (36 yrs)  Name:       Sherry Salas              Visit Date: 02/18/2022 03:37 pm ---------------------------------------------------------------------- Performed By  Attending:        Braxton Feathers DO       Referred By:      Gustavo Lah  Performed By:     Reinaldo Raddle            [facility]                                                             [facility] ---------------------------------------------------------------------- Orders  #  Description                           Code        Ordered By  1  [place] MFM OB FOLLOW UP                   [id number]    Lin Landsman ----------------------------------------------------------------------  #  Order #                     Accession #                Episode #  1  [id number]                   [id number]                 [id number] ---------------------------------------------------------------------- Indications  Medical complication of pregnancy Carylon Perches-     O26.90  Danlos, hypermobility)  Asthma  O99.89 j45.909  [redacted] weeks gestation of pregnancy                Z3A.36 ---------------------------------------------------------------------- Fetal Evaluation  Num Of Fetuses:         1  Fetal Heart Rate(bpm):  140  Cardiac Activity:       Observed  Presentation:           Cephalic  Placenta:               Posterior  P. Cord Insertion:      Previously Visualized  Amniotic Fluid  AFI FV:      Within normal limits  AFI Sum(cm)     %Tile       Largest Pocket(cm)  18.14           69          7.9  RUQ(cm)       RLQ(cm)       LUQ(cm)        LLQ(cm)  4.47          0.94          7.9            4.83 ---------------------------------------------------------------------- Biometry  BPD:     90.05  mm     G. Age:  36w 3d         57  %    CI:        79.76   %    70 - 86                                                          FL/HC:      22.4   %    20.8 - 22.6  HC:    318.64   mm     G. Age:  35w 6d          9  %    HC/AC:      0.96        0.92 - 1.05  AC:    333.42   mm     G. Age:  37w 2d         77  %    FL/BPD:      79.3   %    71 - 87  FL:      71.43  mm     G. Age:  36w 4d         45  %    FL/AC:      21.4   %    20 - 24  HUM:        61  mm     G. Age:  35w 2d         42  %  LV:        2.2  mm  Est. FW:    3048  gm    6 lb 12 oz      58  % ---------------------------------------------------------------------- OB History  Gravidity:    3         Term:   0        Prem:   0        SAB:   2  TOP:  0       Ectopic:  0        Living: 0 ---------------------------------------------------------------------- Gestational Age  LMP:           36w 5d        Date:  06/06/21                   EDD:   03/13/22  U/S Today:     36w 4d                                        EDD:   03/14/22  Best:          36w 5d     Det. By:  LMP  (06/06/21)          EDD:   03/13/22 ---------------------------------------------------------------------- Anatomy  Cranium:               Appears normal         Aortic Arch:            Previously seen  Cavum:                 Appears normal         Ductal Arch:            Previously seen  Ventricles:            Appears normal         Diaphragm:              Appears normal  Choroid Plexus:        Previously seen        Stomach:                Appears normal, left                                                                        sided  Cerebellum:            Previously seen        Abdomen:                Appears normal  Posterior Fossa:       Previously seen        Abdominal Wall:         Previously seen  Nuchal Fold:           Previously seen        Cord Vessels:           Previously seen  Face:                  Profile nl; orbits     Kidneys:                Appear normal                         prev visualized  Lips:                  Previously seen        Bladder:  Appears normal  Thoracic:              Appears normal         Spine:                  Previously seen  Heart:                 Appears normal         Upper Extremities:      Previously seen                         (4CH, axis, and                          situs)  RVOT:                  Previously seen        Lower Extremities:      Previously seen  LVOT:                  Previously seen  Other:  VC, 3VV and 3VTV prev visualized. Nasal bone, lenses, maxilla,          mandible and falx prev visualized. Heels/feet and open hands/5th          digits prev visualized. ---------------------------------------------------------------------- Cervix Uterus Adnexa  Cervix  Not visualized (advanced GA >24wks)  Uterus  No abnormality visualized.  Right Ovary  Not visualized.  Left Ovary  Not visualized.  Cul De Sac  No free fluid seen.  Adnexa  No adnexal mass visualized. ---------------------------------------------------------------------- Comments  Follow-up growth ultrasound at 36w 5d with EDD of  03/13/2022 dated by LMP  (06/06/21).  The patient has EDS  but had a normal aortic root based on an echo at [facility].  Sonographic findings  Single intrauterine pregnancy at 36w 5d.  Observed fetal cardiac activity.  Cephalic presentation.  Interval fetal anatomy appears normal.  Fetal biometry shows the estimated fetal weight at the 58th  percentile.  Amniotic fluid volume: Within normal limits.  Placenta: Posterior.  Recommendations  - Follow up ultrasound as clinically indicated  - Consider delivery around 39-40 weeks ----------------------------------------------------------------------                   Braxton Feathers, DO Electronically Signed Final Report   02/26/2022 08:31 am ----------------------------------------------------------------------    Assessment:  Sherry Salas is a 36 y.o. G44P0030 female at [redacted]w[redacted]d with active labor.   Plan:  1. Admit to Labor & Delivery; consents reviewed and obtained  2. Fetal Well being  - Fetal Tracing: Cat I - GBS neg - Presentation: vtx confirmed by sve   3. Routine OB: - Prenatal labs reviewed, as above - Rh neg - CBC & T&S on admit - Clear fluids, IVF or saline lock  4. Monitoring of Labor -   Contractions by external toco in place -  Plan for continuous fetal monitoring  -  Maternal pain control as desired: IVPM, nitrous, regional anesthesia - Anticipate vaginal delivery  5. Post Partum Planning: - Infant feeding: breastfeeding - Contraception: TBD - Tdap: Given 01/02/22  - Flu: Declined  Amijah Timothy, CNM 03/13/2022 9:07 AM

## 2022-03-13 NOTE — Anesthesia Preprocedure Evaluation (Signed)
Anesthesia Evaluation  Patient identified by MRN, date of birth, ID band Patient awake    Reviewed: Allergy & Precautions, H&P , NPO status , Patient's Chart, lab work & pertinent test results  History of Anesthesia Complications Negative for: history of anesthetic complications  Airway Mallampati: II  TM Distance: <3 FB     Dental  (+) Teeth Intact, Dental Advidsory Given   Pulmonary asthma    Pulmonary exam normal        Cardiovascular Exercise Tolerance: Good negative cardio ROS Normal cardiovascular exam     Neuro/Psych   Anxiety Depression       GI/Hepatic negative GI ROS, Neg liver ROS,,,  Endo/Other  negative endocrine ROS    Renal/GU negative Renal ROS  negative genitourinary   Musculoskeletal   Abdominal   Peds  Hematology negative hematology ROS (+)   Anesthesia Other Findings   Reproductive/Obstetrics (+) Pregnancy                             Anesthesia Physical Anesthesia Plan  ASA: 3  Anesthesia Plan: Epidural   Post-op Pain Management:    Induction:   PONV Risk Score and Plan:   Airway Management Planned:   Additional Equipment:   Intra-op Plan:   Post-operative Plan:   Informed Consent: I have reviewed the patients History and Physical, chart, labs and discussed the procedure including the risks, benefits and alternatives for the proposed anesthesia with the patient or authorized representative who has indicated his/her understanding and acceptance.     Dental Advisory Given  Plan Discussed with: Anesthesiologist  Anesthesia Plan Comments:        Anesthesia Quick Evaluation

## 2022-03-14 DIAGNOSIS — D696 Thrombocytopenia, unspecified: Secondary | ICD-10-CM | POA: Diagnosis not present

## 2022-03-14 DIAGNOSIS — O9081 Anemia of the puerperium: Secondary | ICD-10-CM | POA: Diagnosis not present

## 2022-03-14 LAB — CBC
HCT: 35 % — ABNORMAL LOW (ref 36.0–46.0)
Hemoglobin: 11.6 g/dL — ABNORMAL LOW (ref 12.0–15.0)
MCH: 30.1 pg (ref 26.0–34.0)
MCHC: 33.1 g/dL (ref 30.0–36.0)
MCV: 90.7 fL (ref 80.0–100.0)
Platelets: 113 10*3/uL — ABNORMAL LOW (ref 150–400)
RBC: 3.86 MIL/uL — ABNORMAL LOW (ref 3.87–5.11)
RDW: 13.2 % (ref 11.5–15.5)
WBC: 14 10*3/uL — ABNORMAL HIGH (ref 4.0–10.5)
nRBC: 0 % (ref 0.0–0.2)

## 2022-03-14 LAB — RPR: RPR Ser Ql: NONREACTIVE

## 2022-03-14 MED ORDER — SENNOSIDES-DOCUSATE SODIUM 8.6-50 MG PO TABS: 2.0000 | ORAL_TABLET | Freq: Every day | ORAL | 0 refills | Status: DC

## 2022-03-14 MED ORDER — DIPHENHYDRAMINE HCL 25 MG PO CAPS: 25.0000 mg | ORAL_CAPSULE | Freq: Four times a day (QID) | ORAL | 0 refills | Status: DC | PRN

## 2022-03-14 MED ORDER — WITCH HAZEL-GLYCERIN EX PADS: 1.0000 | MEDICATED_PAD | CUTANEOUS | 12 refills | Status: DC | PRN

## 2022-03-14 MED ORDER — COCONUT OIL OIL: 1.0000 | TOPICAL_OIL | 0 refills | Status: DC | PRN

## 2022-03-14 MED ORDER — ACETAMINOPHEN 325 MG PO TABS: 650.0000 mg | ORAL_TABLET | ORAL | Status: DC | PRN

## 2022-03-14 MED ORDER — IBUPROFEN 600 MG PO TABS: 600.0000 mg | ORAL_TABLET | Freq: Four times a day (QID) | ORAL | 0 refills | Status: DC

## 2022-03-14 MED ORDER — BENZOCAINE-MENTHOL 20-0.5 % EX AERO: 1.0000 | INHALATION_SPRAY | CUTANEOUS | Status: DC | PRN

## 2022-03-14 MED ORDER — FERROUS SULFATE 325 (65 FE) MG PO TABS: 325.0000 mg | ORAL_TABLET | Freq: Two times a day (BID) | ORAL | 0 refills | Status: DC

## 2022-03-14 MED ORDER — SIMETHICONE 80 MG PO CHEW: 80.0000 mg | CHEWABLE_TABLET | ORAL | 0 refills | Status: DC | PRN

## 2022-03-14 NOTE — Anesthesia Postprocedure Evaluation (Signed)
Anesthesia Post Note  Patient: Haylei [address] [employer]  Procedure(s) Performed: AN AD HOC LABOR EPIDURAL  Patient location during evaluation: Mother Baby Anesthesia Type: Epidural Level of consciousness: awake and alert Pain management: pain level controlled Vital Signs Assessment: post-procedure vital signs reviewed and stable Respiratory status: spontaneous breathing, nonlabored ventilation and respiratory function stable Cardiovascular status: stable Postop Assessment: no headache, no backache and epidural receding Anesthetic complications: no   No notable events documented.   Last Vitals:  Vitals:   03/14/22 0030 03/14/22 0730  BP: 94/67 109/85  Pulse: 75 84  Resp: 16 18  Temp: 36.5 C 36.8 C  SpO2: 99% 99%    Last Pain:  Vitals:   03/14/22 0730  TempSrc: Oral  PainSc:                  Rica Mast

## 2022-03-14 NOTE — Lactation Note (Signed)
This note was copied from a baby's chart. Lactation Consultation Note  Patient Name: Sherry Salas [facility] IWLNL'G Date: 03/14/2022 Reason for consult: Initial assessment;Primapara;Term;Breast reduction;Breastfeeding assistance;RN request Age:34 hours  Maternal Data This is mom's 1st baby, SVD. Mom with history of breast reduction X2 (last of the 2 surgeries was 14 years ago per mom), anxiety, depression, ehlers-danlos syndrome.  Today on initial assessment mom reports she has used her pump prior to giving birth and was able to express drops of colostrum. Mom aware of potential impact to her milk supply related to her previous breast surgeries.Mom 's goal is to breastfeed her baby.Per mom baby has been consistently latching on the left breast. Baby has had 1 void and 3 stools at 16 hours of life. Mom would like assistance techniques to position and latch baby specifically at the right breast.  Has patient been taught Hand Expression?: Yes Does the patient have breastfeeding experience prior to this delivery?: No  Feeding Mother's Current Feeding Choice: Breast Milk Baby originally showing some feeding cues. Mom hand expressed colostrum to encourage latch however baby licked colostrum and fell asleep. Per mom last feed was 8:15 am. Baby with large stool diaper changed midway of feeding attempt. Mom found football hold comfortable for positioning. Baby left skin to skin with mom who will attempt to latch baby in 20-30 minutes.  Interventions Interventions: Breast feeding basics reviewed;Assisted with latch;Breast massage;Hand express;Skin to skin;Breast compression;Adjust position;Support pillows;Position options;Education Provided mom with tips and strategies to maximize position and latch technique. Provided wrist support to decrease stress on wrist joint. Reviewed what to expect in First days, including I/O and keeping of feeding/output diary and normative cluster feeding patterns.  Recommended if baby is not consistently breastfeeding to post pump with an electric pump to encourage establishing her milk supply and to offer baby her pumped breastmilk( and/or supplemental milk) if baby did not complete a feeding.   Discharge Discharge Education: Outpatient recommendation Pump: Personal  Consult Status Consult Status: Follow-up Date: 03/14/22 Follow-up type: In-patient  Update provided to care nurse.  Fuller Song 03/14/2022, 11:46 AM

## 2022-03-14 NOTE — Progress Notes (Signed)
Post Partum Day 1  Subjective: Doing well, no complaints.  Tolerating regular diet, pain with PO meds, voiding and ambulating without difficulty.  No CP SOB Fever,Chills, N/V or leg pain; denies nipple or breast pain no HA change of vision, RUQ/epigastric pain  Objective: BP 107/80 (BP Location: Left Arm)   Pulse 83   Temp 97.7 F (36.5 C) (Oral)   Resp 18   Ht 5' 7.01" (1.702 m)   Wt 82.1 kg   LMP 06/06/2021   SpO2 96%   Breastfeeding Unknown   BMI 28.34 kg/m    Physical Exam:  General: NAD Breasts: soft/nontender CV: RRR Pulm: nl effort, CTABL Abdomen: soft, NT, BS x 4 Perineum: minimal edema, laceration repair well approximated Lochia: small Uterine Fundus: fundus firm and 1 fb below umbilicus DVT Evaluation: no cords, ttp LEs   Recent Labs    03/13/22 0921 03/14/22 0323  HGB 13.3 11.6*  HCT 40.0 35.0*  WBC 13.8* 14.0*  PLT 125* 113*    Assessment/Plan: 36 y.o. [id number] postpartum day # 1  - Continue routine PP care - Lactation consult prn - Acute blood loss anemia - hemodynamically stable and asymptomatic; start po ferrous sulfate BID with stool softeners  - low platelets, stable.      Disposition: May desire DC at 24hrs    Randa Ngo, CNM 03/14/2022  3:42 PM

## 2022-03-15 NOTE — Lactation Note (Signed)
This note was copied from a baby's chart. Lactation Consultation Note  Patient Name: Sherry Salas Date: 03/15/2022 Reason for consult: Follow-up assessment;Primapara Age:34 hours  Maternal Data Has patient been taught Hand Expression?: Yes Does the patient have breastfeeding experience prior to this delivery?: No  Feeding Mother's Current Feeding Choice: Breast Milk and Donor Milk Nipple Type: Slow - flow BAby continues to be fussy when not on breast, constantly rooting, mom requests to give baby donor milk per bottle, dad shown how to pace feed, baby still needs firmness to latch to bottle nipple and movt in mouth to latch., baby tends to bite nipple and chomp when feeding, dad shown how to burp baby and baby did burp 2-3 times  LATCH Score Latch: Repeated attempts needed to sustain latch, nipple held in mouth throughout feeding, stimulation needed to elicit sucking reflex.  Audible Swallowing: A few with stimulation  Type of Nipple: Flat (right nipple flat)  Comfort (Breast/Nipple): Filling, red/small blisters or bruises, mild/mod discomfort  Hold (Positioning): Assistance needed to correctly position infant at breast and maintain latch.  LATCH Score: 5   Lactation Tools Discussed/Used Tools: Pump;Nipple Shields Nipple shield size: 24 Breast pump type: Double-Electric Breast Pump Reason for Pumping: pre pump to apply nipple shield Pumping frequency: prn  Interventions Interventions: Pace feeding  Discharge Pump: DEBP;Personal WIC Program: Yes Will send parents home with remaining donor milk, to supp as needed, recommend supp with formula after some feeds once this is used until mom's milk increased as baby's wt is down 6% as of last night, and until 1 st ped appt.  Pump breasts prn to increase stimulation to increase milk output, QT:MAUQJF reduction may need extra stimulation  Consult Status Consult Status: PRN Date: 03/15/22 Follow-up type:  In-patient    Dyann Kief 03/15/2022, 2:49 PM

## 2022-03-15 NOTE — Progress Notes (Signed)
D/c to baby pt/verb understanding of d/c instructions

## 2022-03-15 NOTE — Lactation Note (Signed)
This note was copied from a baby's chart. Lactation Consultation Note  Patient Name: Sherry Salas Date: 03/15/2022 Reason for consult: Follow-up assessment;Primapara;Early term 37-38.6wks;Breast reduction;Maternal discharge;Breastfeeding assistance Age:34 hours  Maternal Data Has patient been taught Hand Expression?: Yes Does the patient have breastfeeding experience prior to this delivery?: No  Feeding Mother's Current Feeding Choice: Breast Milk Baby has been very fussy this am, assessed while ped checking and midwife talking with mom, baby takes few attempts to suck on gloved finger, needs stimulation on tongue to settle and latch and begin sucking, lets suction go frequently, does better when swaddled, once able to work with mom, I had her positioned in side lying position on left side and positioned baby beside her, with baby so fussy and not latching well even with firm surface, decided to apply 24 mm nipple shield to lenghten nipple, baby able to latch after few attempts, baby does better swaddled and  curled inward, otherwise arches away, nursed well x 15 min, then came off, colostrum noted in shield, transferred to right breast in side lying position, this nipple more flat and needed to prepump to elongate nipple, then place shield, baby latched well at first, then came off and was more difficult to calm to latch after this, but eventually was able to latch and nursed 15 min.         LATCH Score Latch: Repeated attempts needed to sustain latch, nipple held in mouth throughout feeding, stimulation needed to elicit sucking reflex. (fuusy, difficulty coord suck)  Audible Swallowing: A few with stimulation  Type of Nipple: Flat (right nipple flat)  Comfort (Breast/Nipple): Filling, red/small blisters or bruises, mild/mod discomfort  Hold (Positioning): Assistance needed to correctly position infant at breast and maintain latch.  LATCH Score: 5   Lactation Tools  Discussed/Used Tools: Pump;Nipple Shields Nipple shield size: 24 Breast pump type: Double-Electric Breast Pump Reason for Pumping: pre pump to assist with latch and application of nipple shield Pumping frequency: prn  Interventions Interventions: Assisted with latch;Skin to skin;Hand express;Pre-pump if needed;Adjust position;Coconut oil;Support pillows;Position options;DEBP;Education Mom given medela nipple shield instruction sheet, lanolin packets Discharge Pump: DEBP;Personal WIC Program: Yes  Consult Status Consult Status: PRN Date: 03/15/22 Follow-up type: In-patient    Sherry Salas 03/15/2022, 1:32 PM

## 2022-03-15 NOTE — Discharge Instructions (Signed)
Discharge Instructions:   If there are any new medications, they have been ordered and will be available for pickup at the listed pharmacy on your way home from the hospital.   Call office if you have any of the following: headache, visual changes, fever >101.0 F, chills, shortness of breath, breast concerns, excessive vaginal bleeding, incision drainage or problems, leg pain or redness, depression or any other concerns. If you have vaginal discharge with an odor, let your doctor know.   It is normal to bleed for up to 6 weeks. You should not soak through more than 1 pad in 1 hour. If you have a blood clot larger than your fist with continued bleeding, call your doctor.   Activity: Do not lift > 10 lbs for 6 weeks (do not lift anything heavier than your baby). No intercourse, tampons, swimming pools, hot tubs, baths (only showers) for 6 weeks.  No driving for 1-2 weeks. Continue prenatal vitamin, especially if breastfeeding. Increase calories and fluids (water) while breastfeeding.   Your milk will come in, in the next couple of days (right now it is colostrum). You may have a slight fever when your milk comes in, but it should go away on its own.  If it does not, and rises above 101 F please call the doctor. You will also feel achy and your breasts will be firm. They will also start to leak. If you are breastfeeding, continue as you have been and you can pump/express milk for comfort.   If you have too much milk, your breasts can become engorged, which could lead to mastitis. This is an infection of the milk ducts. It can be very painful and you will need to notify your doctor to obtain a prescription for antibiotics. You can also treat it with a shower or hot/cold compress.   For concerns about your baby, please call your pediatrician.  For breastfeeding concerns, the lactation consultant can be reached at [phone].   Postpartum blues (feelings of happy one minute and sad another minute)  are normal for the first few weeks but if it gets worse let your doctor know.   Congratulations! We enjoyed caring for you and your new bundle of joy!  

## 2022-03-16 ENCOUNTER — Ambulatory Visit: Payer: Self-pay

## 2022-03-16 NOTE — Lactation Note (Signed)
This note was copied from a baby's chart. Lactation Consultation Note  Patient Name: Sherry Salas [facility] RJJOA'C Date: 03/16/2022 Reason for consult: Follow-up assessment;Primapara;Term;Breast reduction Age:36 years  Maternal Data Has patient been taught Hand Expression?: Yes Does the patient have breastfeeding experience prior to this delivery?: No  Mom with hx of 2 breast reduction surgeries that included cutting and removing the nipple stalk, desires to breast feed. Was at first receiving colostrum with both hand expression, and pumping, but has not gotten drops the last few feeding/pumping times.  We discussed the possibility of low milk production due to the surgeries, and importance of baby being fed. Mom understands the impact that the surgeries have had on the milk ducts and possible inability to produce a plentiful milk supply.  At this time mom voices that she would like to continue with pumping routine, but take a break from the baby at the breast due to soreness/discomfort, and provided what is left of the thawed donor milk and then formula.  Feeding Mother's Current Feeding Choice: Breast Milk and Formula  LATCH Score    Lactation Tools Discussed/Used Tools: Pump;Nipple Dorris Carnes;Bottle Nipple shield size: 24 Breast pump type: Double-Electric Breast Pump Pump Education: Setup, frequency, and cleaning;Milk Storage Reason for Pumping: breast reduction Pumping frequency: q 3 hours  Reviewed pumping frequency and stimulation of the breast tissue every 3 hours. Mom has personal pump at home, and will plan to continue after discharge.  Interventions Interventions: Breast feeding basics reviewed;Hand express;DEBP;Education;Pace feeding (Pumping routine, extra stimulation, bottle feeding)  Discharge Discharge Education: Warning signs for feeding baby;Outpatient recommendation Pump: Personal  Consult Status Consult Status: Complete  Outpatient lactation number  provided. Mom voices a desire for outpatient appointment maybe in a week after focusing on pumping for a while to see if there is progress in milk production before working to bring baby back to the breast.  Danford Bad 03/16/2022, 11:04 AM

## 2022-04-01 ENCOUNTER — Ambulatory Visit: Payer: Medicaid Other | Admitting: Licensed Clinical Social Worker

## 2022-04-01 DIAGNOSIS — F4322 Adjustment disorder with anxiety: Secondary | ICD-10-CM

## 2022-04-01 NOTE — Progress Notes (Signed)
Counselor/Therapist Progress Note  Patient ID: Sherry Salas, MRN: [id number],    Date: 04/01/2022  Time Spent: 50 minutes    Treatment Type: Psychotherapy  Reported Symptoms:  Anxiety, anxious thoughts- related to others caring for baby  Mental Status Exam:  Appearance:   Casual and Neat     Behavior:  Appropriate, Sharing, and Motivated  Motor:  Normal  Speech/Language:   Clear and Coherent and Normal Rate  Affect:  Appropriate, Congruent, Full Range, and Tearful  Mood:  normal  Thought process:  goal directed  Thought content:    WNL  Sensory/Perceptual disturbances:    WNL  Orientation:  oriented to person, place, time/date, and situation  Attention:  Good  Concentration:  Good  Memory:  WNL  Fund of knowledge:   Good  Insight:    Good  Judgment:   Good  Impulse Control:  Good   Risk Assessment: Danger to Self:  No Self-injurious Behavior: No Danger to Others: No Duty to Warn:no Physical Aggression / Violence:No  Access to Firearms a concern: No  Gang Involvement:No   Subjective: Patient was engaged and cooperative throughout the session using time effectively to discuss delivery, post partum, and adjusting to new baby. Patient voices anxiousness and reports adjusting well to new baby, denies any depressive symptoms Patient is likely to benefit from future treatment and remains motivated to decrease anxiety  and reports benefit of regular sessions in addressing these symptoms.   Interventions: Cognitive Behavioral Therapy Established psychological safety. Checked in with patient. Patient delivered on 03/13/22. LCSW conducted a postpartum mood disorder check.  Conducted brief assessment - anxious thoughts related to making sure baby is okay, sleeping when baby sleeps, good support system, discontinued breastfeeding. LCSW engaged patient in processing delivery and experience at the hospital. Discussed self-blame and blaming others, acceptance of stopping breastfeeding  after 11 days, and progress towards adjusting to new baby. LCSW discussed medication as option if anxiety symptoms worsen. Discussed self-care strategies to prevent and/or reduce symptoms of postpartum mood and anxiety disorders, including sleep hygiene, eating regularly, drinking fluids, getting outside, and support from partner, family, and/or friends.   Informed patient to call her provider right away or get emergency help if she experiences any of the following symptoms: Feelings of hopelessness and total despair. Feeling out of touch with reality (hearing or seeing things other people don't). Feeling that you might hurt yourself or your baby.  Provided support through active listening, validation of feelings, and highlighted patient's strengths.    Diagnosis:   ICD-10-CM   1. Adjustment disorder with anxious mood  F43.22       Plan:  Patient's goal of treatment is to be able to recognize thoughts, demand evidence for thoughts, enjoy the moment, be more optimistic and also wants to connect with this baby.    Treatment Target: Understand the relationship between thoughts, emotions, and behaviors  Psychoeducation on CBT model   Oriented the client to the therapeutic approach Teach the connection between thoughts, emotions, and behaviors  Treatment Target: Increase realistic balanced thinking -to learn how to replace thinking with thoughts that are more accurate or helpful Explore patient's thoughts, beliefs, automatic thoughts, assumptions  Identify and replace unhelpful thinking patterns (upsetting ideas, self-talk and mental images) Process distress and allow for emotional release  Questioning and challenging thoughts Cognitive reappraisal  Restructuring, Socratic questioning  Treatment Target: Increase Contact With the Present Moment  Explore client's ability to be "in-touch" with the present moment Teach mindfulness skills  Noting or describing  Body scan meditation   Loving-Kindness meditation Anchor breathing - mindful grounding   Future Appointments  Date Time Provider [place]  04/07/2022  9:00 AM Milton Ferguson, LCSW [facility] None    Milton Ferguson, [place]

## 2022-04-07 ENCOUNTER — Ambulatory Visit: Payer: Medicaid Other | Admitting: Licensed Clinical Social Worker

## 2022-04-07 DIAGNOSIS — F4322 Adjustment disorder with anxiety: Secondary | ICD-10-CM

## 2022-04-07 NOTE — Progress Notes (Signed)
Counselor/Therapist Progress Note  Patient ID: Sherry Salas, MRN: [id number],    Date: 04/07/2022  Time Spent: 45 minutes    Treatment Type: Psychotherapy  Reported Symptoms:  Anxiety, anxious thoughts, sleeping well  Mental Status Exam:  Appearance:   Casual and Well Groomed     Behavior:  Appropriate, Sharing, and Motivated  Motor:  Normal  Speech/Language:   Clear and Coherent and Normal Rate  Affect:  Appropriate, Congruent, and Full Range  Mood:  normal  Thought process:  normal  Thought content:    WNL  Sensory/Perceptual disturbances:    WNL  Orientation:  oriented to person, place, time/date, and situation  Attention:  Good  Concentration:  Good  Memory:  WNL  Fund of knowledge:   Good  Insight:    Fair  Judgment:   Fair  Impulse Control:  Fair   Risk Assessment: Danger to Self:  No Self-injurious Behavior: No Danger to Others: No Duty to Warn:no Physical Aggression / Violence:No  Access to Firearms a concern: No  Gang Involvement:No   Subjective: Patient was engaged and cooperative throughout the session using time effectively to discuss thoughts and feelings related to adjustment to new baby. Patient voices some anxiety and anxious thoughts but overall adjusting very well. Patient is likely to benefit from future treatment because she remains motivated to decrease anxiety and reports benefit of regular sessions in addressing  anxiety symptoms.   Interventions: Cognitive Behavioral Therapy and client centered  Established psychological safety. Checked in with patient regarding her week. Reviewed previous session regarding adjusting to new baby. Engaged patient in processing thoughts and emotions related to challenges with breastfeeding and worries about driving with baby. Validated patient's feelings of frustration/grief and anxiousness. LCSW assisted patient with balanced thinking and radical acceptance. LCSW encouraged patient to discuss breastfeeding  questions with educator at [place]. Reviewed the importance of sleep and eating regularly and encouraged patient to continue to use support system and get outside daily. Provided support through active listening, validation of feelings, and highlighted patient's strengths.   Diagnosis:   ICD-10-CM   1. Adjustment disorder with anxious mood  F43.22      Plan:  Patient's goal of treatment is to be able to recognize thoughts, demand evidence for thoughts, enjoy the moment, be more optimistic and also wants to connect with this baby.    Treatment Target: Understand the relationship between thoughts, emotions, and behaviors  Psychoeducation on CBT model   Oriented the client to the therapeutic approach Teach the connection between thoughts, emotions, and behaviors  Treatment Target: Increase realistic balanced thinking -to learn how to replace thinking with thoughts that are more accurate or helpful Explore patient's thoughts, beliefs, automatic thoughts, assumptions  Identify and replace unhelpful thinking patterns (upsetting ideas, self-talk and mental images) Process distress and allow for emotional release  Questioning and challenging thoughts Cognitive reappraisal  Restructuring, Socratic questioning  Treatment Target: Increase Contact With the Present Moment  Explore client's ability to be "in-touch" with the present moment Teach mindfulness skills  Noting or describing  Body scan meditation  Loving-Kindness meditation Anchor breathing - mindful grounding   Future Appointments  Date Time Provider [place]  04/21/2022  9:00 AM Milton Ferguson, LCSW [facility] None    Milton Ferguson, [place]

## 2022-04-09 DIAGNOSIS — N898 Other specified noninflammatory disorders of vagina: Secondary | ICD-10-CM | POA: Diagnosis not present

## 2022-04-09 DIAGNOSIS — G5601 Carpal tunnel syndrome, right upper limb: Secondary | ICD-10-CM | POA: Diagnosis not present

## 2022-04-16 DIAGNOSIS — Z1332 Encounter for screening for maternal depression: Secondary | ICD-10-CM | POA: Diagnosis not present

## 2022-04-21 ENCOUNTER — Ambulatory Visit: Payer: Medicaid Other | Admitting: Licensed Clinical Social Worker

## 2022-04-21 DIAGNOSIS — F4322 Adjustment disorder with anxiety: Secondary | ICD-10-CM

## 2022-04-21 NOTE — Progress Notes (Signed)
Counselor/Therapist Progress Note  Patient ID: Sherry Salas, MRN: [id number],    Date: 04/21/2022  Time Spent: 50 minutes    Treatment Type: Psychotherapy  Reported Symptoms:  Anxiety, anxious thoughts and worries; Nightmares  Mental Status Exam:  Appearance:   Casual and Well Groomed     Behavior:  Appropriate, Sharing, and Motivated  Motor:  Normal  Speech/Language:   Clear and Coherent and Normal Rate  Affect:  Appropriate, Congruent, and Full Range  Mood:  normal  Thought process:  normal  Thought content:    WNL  Sensory/Perceptual disturbances:    WNL  Orientation:  oriented to person, place, time/date, situation, and day of week  Attention:  Good  Concentration:  Good  Memory:  WNL  Fund of knowledge:   Good  Insight:    Fair  Judgment:   Fair  Impulse Control:  Fair   Risk Assessment: Danger to Self:  No Self-injurious Behavior: No Danger to Others: No Duty to Warn:no Physical Aggression / Violence:No  Access to Firearms a concern: No  Gang Involvement:No   Subjective: Patient was engaged and cooperative throughout the session using time effectively to discuss thoughts and feelings. Patient voices continued motivation for treatment and understanding of anxiety issues. Patient is likely to benefit from future treatment because  She remains motivated to decrease anxiety and reports benefit of regular sessions in addressing these symptoms.   Interventions: Cognitive Behavioral Therapy and client centered  Checked in with patient regarding their week. Clinician met with patient to identify needs related to stressors and functioning, and assess and monitor for signs and symptoms of anxiety and depression, and assess safety. The clinician processed with the patient how they have been doing since the last follow-up session. LCSW assisted patient in processing their emotions about what they have experienced related to medical concerns and family challenges. Discussed  patient's experience of nightmares and possible causes- past trauma (needs further assessed), interrupted sleep due to new baby.  LCSW reviewed sleep hygiene, the importance of healthy diet, and encouraged patient to practice informal mindfulness. Provided support through active listening, validation of feelings, and highlighted patient's strengths.   Diagnosis:   ICD-10-CM   1. Adjustment disorder with anxious mood  F43.22       Plan:  Patient's goal of treatment is to be able to recognize thoughts, demand evidence for thoughts, enjoy the moment, be more optimistic and also wants to connect with this baby.    Treatment Target: Understand the relationship between thoughts, emotions, and behaviors  Psychoeducation on CBT model   Oriented the client to the therapeutic approach Teach the connection between thoughts, emotions, and behaviors  Treatment Target: Increase realistic balanced thinking -to learn how to replace thinking with thoughts that are more accurate or helpful Explore patient's thoughts, beliefs, automatic thoughts, assumptions  Identify and replace unhelpful thinking patterns (upsetting ideas, self-talk and mental images) Process distress and allow for emotional release  Questioning and challenging thoughts Cognitive reappraisal  Restructuring, Socratic questioning  Treatment Target: Increase Contact With the Present Moment  Explore client's ability to be "in-touch" with the present moment Teach mindfulness skills  Noting or describing  Body scan meditation  Loving-Kindness meditation Anchor breathing - mindful grounding   Future Appointments  Date Time Provider [place]  05/12/2022  9:00 AM Milton Ferguson, LCSW [facility] None    Milton Ferguson, LCSW

## 2022-04-22 DIAGNOSIS — N938 Other specified abnormal uterine and vaginal bleeding: Secondary | ICD-10-CM | POA: Diagnosis not present

## 2022-04-23 DIAGNOSIS — R87618 Other abnormal cytological findings on specimens from cervix uteri: Secondary | ICD-10-CM | POA: Diagnosis not present

## 2022-04-28 ENCOUNTER — Telehealth: Payer: Self-pay

## 2022-04-28 NOTE — Telephone Encounter (Signed)
[facility]- Discharge Call Backs 1-Do you have any questions or concerns about yourself as you heal? OBGYN knows but slower to heal.   2-Any concerns or questions about your baby?No Is your baby eating, peeing,pooping well?Yes 3-Where does your baby sleep in your home? Crib  Review ABC's of safe sleep. 4-How was your stay at the hospital? [facility].  Wanted to recognize a certain nurse who gave her donor milk but could not remember her name.   You should be receiving a survey in the mail soon.   We would really appreciate it if you could fill that out for [place] and return it in the mail.  We value the feedback to make improvements and continue the great work we do.   If you have any questions please feel free to call me back at [phone]

## 2022-05-12 ENCOUNTER — Ambulatory Visit: Payer: Medicaid Other | Admitting: Licensed Clinical Social Worker

## 2022-05-12 DIAGNOSIS — F4322 Adjustment disorder with anxiety: Secondary | ICD-10-CM

## 2022-05-12 NOTE — Progress Notes (Signed)
Counselor/Therapist Progress Note  Patient ID: Sherry Salas, MRN: [id number],    Date: 05/12/2022  Time Spent: 45 minutes    Treatment Type: Psychotherapy  Reported Symptoms:  Anxiety, anxious thoughts, worries, sleep disturbance, nightmares   Mental Status Exam:  Appearance:   Casual and Neat     Behavior:  Appropriate, Sharing, and Motivated  Motor:  Normal  Speech/Language:   Clear and Coherent  Affect:  Appropriate, Congruent, and Full Range  Mood:  normal  Thought process:  normal  Thought content:    WNL  Sensory/Perceptual disturbances:    WNL  Orientation:  oriented to person, place, time/date, situation, and day of week  Attention:  Good  Concentration:  Good  Memory:  WNL  Fund of knowledge:   Good  Insight:    Fair  Judgment:   Fair  Impulse Control:  Fair   Risk Assessment: Danger to Self:  No Self-injurious Behavior: No Danger to Others: No Duty to Warn:no Physical Aggression / Violence:No  Access to Firearms a concern: No  Gang Involvement:No   Subjective: Patient was engaged and cooperative throughout the session using time effectively to discuss anxiety symptoms, thought, feelings, and coping skills. She reports anxiety and worries about many things and having nightmares most days. Patient was receptive to feedback and intervention from LCSW and reports she will discuss medication management with her obgyn.  Patient is likely to benefit from future treatment in combination with medication management.    Interventions: Cognitive Behavioral Therapy and Mindfulness Meditation Established psychological safety. Checked in with patient regarding their week. Clinician met with patient to identify needs related to stressors and functioning, and assess and monitor for signs and symptoms of anxiety and depression, and assess safety. The clinician processed with the patient how they have been doing since the last follow-up session. LCSW reviewed postpartum  anxiety, discussed patient's openness to medication management evaluation; reviewed informal mindfulness meditation, and reframed unhelpful thoughts and validated patient's feelings related to worries about driving, riding in cars with others and being out with baby. Provided support through active listening, validation of feelings, and highlighted patient's strengths.   Diagnosis:   ICD-10-CM   1. Adjustment disorder with anxious mood  F43.22      Plan: Patient's goal of treatment is to be able to recognize thoughts, demand evidence for thoughts, enjoy the moment, be more optimistic and also wants to connect with this baby.    Treatment Target: Understand the relationship between thoughts, emotions, and behaviors  Psychoeducation on CBT model   Oriented the client to the therapeutic approach Teach the connection between thoughts, emotions, and behaviors  Treatment Target: Increase realistic balanced thinking -to learn how to replace thinking with thoughts that are more accurate or helpful Explore patient's thoughts, beliefs, automatic thoughts, assumptions  Identify and replace unhelpful thinking patterns (upsetting ideas, self-talk and mental images) Process distress and allow for emotional release  Questioning and challenging thoughts Cognitive reappraisal  Restructuring, Socratic questioning  Treatment Target: Increase Contact With the Present Moment  Explore client's ability to be "in-touch" with the present moment Teach mindfulness skills  Noting or describing  Body scan meditation  Loving-Kindness meditation Anchor breathing - mindful grounding   Future Appointments  Date Time Provider [place]  05/26/2022  9:00 AM Milton Ferguson, LCSW [facility] None    Milton Ferguson, LCSW

## 2022-05-26 ENCOUNTER — Ambulatory Visit: Payer: Medicaid Other | Admitting: Licensed Clinical Social Worker

## 2022-05-26 DIAGNOSIS — F4322 Adjustment disorder with anxiety: Secondary | ICD-10-CM

## 2022-05-26 NOTE — Progress Notes (Signed)
Counselor/Therapist Progress Note  Patient ID: Sherry Salas, MRN: [id number],    Date: 05/26/2022  Time Spent: 45 minutes    Treatment Type: Psychotherapy  Reported Symptoms:  Lower anxiety, one nightmare since last session  Mental Status Exam:  Appearance:   Casual and Well Groomed     Behavior:  Appropriate, Sharing, and Motivated  Motor:  Normal  Speech/Language:   Clear and Coherent and Normal Rate  Affect:  Appropriate, Congruent, and Full Range  Mood:  normal  Thought process:  normal  Thought content:    WNL  Sensory/Perceptual disturbances:    WNL  Orientation:  oriented to person, place, time/date, situation, and day of week  Attention:  Good  Concentration:  Good  Memory:  WNL  Fund of knowledge:   Good  Insight:    Fair  Judgment:   Fair  Impulse Control:  Fair   Risk Assessment: Danger to Self:  No Self-injurious Behavior: No Danger to Others: No Duty to Warn:no Physical Aggression / Violence:No  Access to Firearms a concern: No  Gang Involvement:No   Subjective: Patient was engaged and cooperative throughout the session using time effectively to discuss thoughts and feelings. Patient reports improvement in nightmares and overall managed anxiety. She is in agreement to schedule another appointment for one month out.  Interventions: Cognitive Behavioral Therapy and client centered  Established psychological safety. Clinician met with patient to identify needs related to stressors and functioning, and assess and monitor for signs and symptoms of anxiety , and assess safety. The clinician processed with the patient how they have been doing since the last follow-up session. LCSW assisted patient in processing thoughts and feelings related to challenges regarding telling her dad about her baby. Provided support through active listening, validation of feelings, and highlighted patient's strengths.   Diagnosis:   ICD-10-CM   1. Adjustment disorder with anxious  mood  F43.22      Plan: Patient's goal of treatment is to be able to recognize thoughts, demand evidence for thoughts, enjoy the moment, be more optimistic and also wants to connect with this baby.    Treatment Target: Understand the relationship between thoughts, emotions, and behaviors  Psychoeducation on CBT model   Oriented the client to the therapeutic approach Teach the connection between thoughts, emotions, and behaviors  Treatment Target: Increase realistic balanced thinking -to learn how to replace thinking with thoughts that are more accurate or helpful Explore patient's thoughts, beliefs, automatic thoughts, assumptions  Identify and replace unhelpful thinking patterns (upsetting ideas, self-talk and mental images) Process distress and allow for emotional release  Questioning and challenging thoughts Cognitive reappraisal  Restructuring, Socratic questioning  Treatment Target: Increase Contact With the Present Moment  Explore client's ability to be "in-touch" with the present moment Teach mindfulness skills  Noting or describing  Body scan meditation  Loving-Kindness meditation Anchor breathing - mindful grounding   Future Appointments  Date Time Provider [place]  06/23/2022  9:00 AM Milton Ferguson, LCSW [facility] None     Milton Ferguson, LCSW

## 2022-06-23 ENCOUNTER — Ambulatory Visit: Payer: Medicaid Other | Admitting: Licensed Clinical Social Worker

## 2022-06-23 DIAGNOSIS — F4322 Adjustment disorder with anxiety: Secondary | ICD-10-CM

## 2022-06-23 NOTE — Progress Notes (Signed)
Counselor/Therapist Progress Note  Patient ID: Rolayne Schaible, MRN: [id number],    Date: 06/23/2022  Time Spent: 32 minutes    Treatment Type: Psychotherapy  Reported Symptoms:  over all "great", continued anxiety, anxious thoughts, intrusive anxious thoughts   Mental Status Exam:  Appearance:   Casual, Neat, and Well Groomed     Behavior:  Appropriate, Sharing, and Motivated  Motor:  Normal  Speech/Language:   Clear and Coherent and Normal Rate  Affect:  Appropriate, Non-Congruent, and Full Range  Mood:  normal  Thought process:  normal  Thought content:    WNL  Sensory/Perceptual disturbances:    WNL  Orientation:  oriented to person, place, time/date, and situation  Attention:  Good  Concentration:  Good  Memory:  WNL  Fund of knowledge:   Good  Insight:    Fair  Judgment:   Fair  Impulse Control:  Fair   Risk Assessment: Danger to Self:  No Self-injurious Behavior: No Danger to Others: No Duty to Warn:no Physical Aggression / Violence:No  Access to Firearms a concern: No  Gang Involvement:No   Subjective: Patient was engaged and cooperative throughout the session using time effectively to discuss thoughts and feelings. She reports all has been "great" she also reports anxious thoughts. Patient voices a desire to discontinue treatment at this time, she reports she is in a good place. Patient agrees to reach out in the future, if needed.   Interventions: Cognitive Behavioral Therapy and client centered  Established psychological safety.  LCSW processed with the patient how they have been doing since the last follow-up session. Explored patient's experience of anxiety and intrusive thoughts. LCSW reviewed noticing thoughts, accepting the thoughts without judgement, reframing thoughts and challenging thoughts and encouraged patient to continue sleep routine as she reports benefit. Discussed termination of services, including patient's progress and encouraged patient to  reach out as needed in the future.    Diagnosis:   ICD-10-CM   1. Adjustment disorder with anxious mood  F43.22       Plan: Patient reports meeting goal of treatment, benefit from treatment and will reach out in future, as needed.   Milton Ferguson, LCSW

## 2023-05-13 DIAGNOSIS — N912 Amenorrhea, unspecified: Secondary | ICD-10-CM | POA: Diagnosis not present

## 2023-05-13 DIAGNOSIS — O200 Threatened abortion: Secondary | ICD-10-CM | POA: Diagnosis not present

## 2023-05-15 DIAGNOSIS — O200 Threatened abortion: Secondary | ICD-10-CM | POA: Diagnosis not present

## 2023-05-22 DIAGNOSIS — O021 Missed abortion: Secondary | ICD-10-CM | POA: Diagnosis not present

## 2023-05-22 DIAGNOSIS — O3680X0 Pregnancy with inconclusive fetal viability, not applicable or unspecified: Secondary | ICD-10-CM | POA: Diagnosis not present

## 2023-05-22 DIAGNOSIS — O200 Threatened abortion: Secondary | ICD-10-CM | POA: Diagnosis not present

## 2023-06-02 DIAGNOSIS — O021 Missed abortion: Secondary | ICD-10-CM | POA: Diagnosis not present

## 2023-06-03 DIAGNOSIS — N96 Recurrent pregnancy loss: Secondary | ICD-10-CM | POA: Diagnosis not present

## 2023-06-03 DIAGNOSIS — O034 Incomplete spontaneous abortion without complication: Secondary | ICD-10-CM | POA: Diagnosis not present

## 2023-06-04 ENCOUNTER — Encounter: Payer: Self-pay | Admitting: Urgent Care

## 2023-06-04 ENCOUNTER — Other Ambulatory Visit: Payer: Self-pay

## 2023-06-04 ENCOUNTER — Encounter
Admission: RE | Admit: 2023-06-04 | Discharge: 2023-06-04 | Disposition: A | Discharge status: Home / Self Care | Source: Ambulatory Visit | Attending: Obstetrics and Gynecology | Admitting: Obstetrics and Gynecology

## 2023-06-04 VITALS — Ht 67.0 in | Wt 161.0 lb

## 2023-06-04 DIAGNOSIS — Z01812 Encounter for preprocedural laboratory examination: Secondary | ICD-10-CM

## 2023-06-04 DIAGNOSIS — O021 Missed abortion: Secondary | ICD-10-CM

## 2023-06-04 DIAGNOSIS — O034 Incomplete spontaneous abortion without complication: Secondary | ICD-10-CM | POA: Insufficient documentation

## 2023-06-04 HISTORY — DX: Missed abortion: O02.1

## 2023-06-04 LAB — CBC
HCT: 38.8 % (ref 36.0–46.0)
Hemoglobin: 12.8 g/dL (ref 12.0–15.0)
MCH: 29.6 pg (ref 26.0–34.0)
MCHC: 33 g/dL (ref 30.0–36.0)
MCV: 89.6 fL (ref 80.0–100.0)
Platelets: 283 10*3/uL (ref 150–400)
RBC: 4.33 MIL/uL (ref 3.87–5.11)
RDW: 12.4 % (ref 11.5–15.5)
WBC: 8.6 10*3/uL (ref 4.0–10.5)
nRBC: 0 % (ref 0.0–0.2)

## 2023-06-04 LAB — TYPE AND SCREEN
ABO/RH(D): O POS
Antibody Screen: NEGATIVE
Extend sample reason: UNDETERMINED

## 2023-06-04 NOTE — H&P (Signed)
     [name] is a 38 y.o. female [phone] here for Pre Op Consulting (Sign consents)     History: Patient is 8 weeks by LMP and 6 weeks by U/s Patient's last menstrual period was 03/13/2023 (exact date). EDC 12/18/23 by LMP and 01/06/24 by u/s She denies bleeding. Cramping: no Tissue passage: no Blood type is O pos   U/S on 05/13/23: Single Intrauterine pregnancy with no cardiac activity seen with M-mode or Color doppler. An estimated gestational age of [redacted]w[redacted]d(CRL) and [redacted]w[redacted]d(MSD) Dating assigned by LMP provided. Cervical length is 4.15 cm.  Right ovary contains a complex cyst detailed above; Left collapsed corpus luteum. No free fluid seen   Patient's last menstrual period was 03/13/2023 (exact date).    Interval history: Completed 3 doses of Cytotec - last dose on 2/25 with bleeding and clots for about 3 hours   Beta HCGs 05/21/2537,135.0 05/14/2560,543.0 05/21/2564,724.0        Exam:    BP 107/77   Pulse 78   Ht 170.2 cm (5\' 7" )   Wt 73 kg (161 lb)   LMP 03/13/2023 (Exact Date)   Breastfeeding No   BMI 25.22 kg/m    Constitutional:  General appearance: Well nourished, well developed female in no acute distress.  CV: RRR Pulm: CTAB Neuro/psych:  Normal mood and affect. No gross motor deficits. Neck:  Supple, normal appearance.  Respiratory:  Normal respiratory effort, no use of accessory muscles Skin:  No visible rashes or external lesions   Pelvic:  deferred   Impression:    The primary encounter diagnosis was Incomplete abortion ([facility]-HCC). 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   Plan:    Spontaneous miscarriage: Causes of spontaneous miscarriage discussed with patient, including prevalence, common causes, and the expectation that this event does not increase the chance that she will miscarry again in the future. Emotional support given.   She is Rh positive, and therefore does not need Rhogam   Surgical  management:   Consents not signed today. Risks of surgery were discussed with the patient including but not limited to: bleeding which may require transfusion; infection which may require antibiotics; injury to uterus or surrounding organs; intrauterine scarring which may impair future fertility; need for additional procedures including laparotomy or laparoscopy; and other postoperative/anesthesia complications.    This is a scheduled same-day surgery. She will have a postop visit in 2 weeks to review operative findings and pathology, and work up RPL   Recurrent pregnancy loss:   Karyotype - of parents, of fetal products (step two) Uterine assessment- SIS, hysteroscopy Habitual loss panel - APLS testing (lupus anticoagulant, anti B2 microglobulin and anti-cardiolipin)  Thyroid function   Return if symptoms worsen or fail to improve.

## 2023-06-04 NOTE — Patient Instructions (Signed)
 Your procedure is scheduled on: Friday, March 7 Report to the Registration Desk on the 1st floor of the [facility] To find out your arrival time, please call [phone] between 1PM - 3PM on: Thursday, March 6 If your arrival time is 6:00 am, do not arrive before that time as the [facility] entrance doors do not open until 6:00 am.  REMEMBER: Instructions that are not followed completely may result in serious medical risk, up to and including death; or upon the discretion of your surgeon and anesthesiologist your surgery may need to be rescheduled.  Do not eat food after midnight the night before surgery.  No gum chewing or hard candies.  You may however, drink CLEAR liquids up to 2 hours before you are scheduled to arrive for your surgery. Do not drink anything within 2 hours of your scheduled arrival time.  Clear liquids include: - water  - apple juice without pulp - gatorade (not RED colors) - black coffee or tea (Do NOT add milk or creamers to the coffee or tea) Do NOT drink anything that is not on this list.  One week prior to surgery: Stop Anti-inflammatories (NSAIDS) such as Advil, Aleve, Ibuprofen, Motrin, Naproxen, Naprosyn and Aspirin based products such as Excedrin, Goody's Powder, BC Powder. Stop ANY OVER THE COUNTER supplements until after surgery.  You may however, continue to take Tylenol if needed for pain up until the day of surgery.  Continue taking all of your other prescription medications up until the day of surgery.  ON THE DAY OF SURGERY DO NOT TAKE ANY MEDICATIONS   No Alcohol for 24 hours before or after surgery.  No Smoking including e-cigarettes for 24 hours before surgery.  No chewable tobacco products for at least 6 hours before surgery.  No nicotine patches on the day of surgery.  Do not use any "recreational" drugs for at least a week (preferably 2 weeks) before your surgery.  Please be advised that the combination of cocaine and anesthesia  may have negative outcomes, up to and including death. If you test positive for cocaine, your surgery will be cancelled.  On the morning of surgery brush your teeth with toothpaste and water, you may rinse your mouth with mouthwash if you wish. Do not swallow any toothpaste or mouthwash.  Do not wear jewelry, make-up, hairpins, clips or nail polish.  For welded (permanent) jewelry: bracelets, anklets, waist bands, etc.  Please have this removed prior to surgery.  If it is not removed, there is a chance that hospital personnel will need to cut it off on the day of surgery.  Do not wear lotions, powders, or perfumes.   Do not shave body hair from the neck down 48 hours before surgery.  Contact lenses, hearing aids and dentures may not be worn into surgery.  Do not bring valuables to the hospital. [facility] is not responsible for any missing/lost belongings or valuables.   Notify your doctor if there is any change in your medical condition (cold, fever, infection).  Wear comfortable clothing (specific to your surgery type) to the hospital.  After surgery, you can help prevent lung complications by doing breathing exercises.  Take deep breaths and cough every 1-2 hours.   If you are being discharged the day of surgery, you will not be allowed to drive home. You will need a responsible individual to drive you home and stay with you for 24 hours after surgery.   If you are taking public transportation,  you will need to have a responsible individual with you.  Please call the Pre-admissions Testing Dept. at [phone] if you have any questions about these instructions.  Surgery Visitation Policy:  Patients having surgery or a procedure may have two visitors.  Children under the age of 33 must have an adult with them who is not the patient.  Temporary Visitor Restrictions Due to increasing cases of flu, RSV and COVID-19: Children ages 102 and under will not be able to visit  patients in [facility] hospitals under most circumstances.

## 2023-06-05 ENCOUNTER — Encounter: Payer: Self-pay | Admitting: Obstetrics and Gynecology

## 2023-06-05 ENCOUNTER — Encounter
Admission: RE | Disposition: A | Discharge status: Home / Self Care | Payer: Self-pay | Source: Home / Self Care | Attending: Obstetrics and Gynecology

## 2023-06-05 ENCOUNTER — Ambulatory Visit: Admitting: Anesthesiology

## 2023-06-05 ENCOUNTER — Other Ambulatory Visit: Payer: Self-pay

## 2023-06-05 ENCOUNTER — Ambulatory Visit
Admission: RE | Admit: 2023-06-05 | Discharge: 2023-06-05 | Disposition: A | Discharge status: Home / Self Care | Attending: Obstetrics and Gynecology | Admitting: Obstetrics and Gynecology

## 2023-06-05 DIAGNOSIS — O032 Embolism following incomplete spontaneous abortion: Secondary | ICD-10-CM | POA: Diagnosis not present

## 2023-06-05 DIAGNOSIS — N96 Recurrent pregnancy loss: Secondary | ICD-10-CM | POA: Diagnosis not present

## 2023-06-05 DIAGNOSIS — O034 Incomplete spontaneous abortion without complication: Secondary | ICD-10-CM | POA: Insufficient documentation

## 2023-06-05 HISTORY — PX: DILATION AND EVACUATION: SHX1459

## 2023-06-05 SURGERY — DILATION AND EVACUATION, UTERUS
Anesthesia: General | Site: Uterus

## 2023-06-05 MED ORDER — LACTATED RINGERS IV SOLN: INTRAVENOUS | Status: DC

## 2023-06-05 MED ORDER — PROPOFOL 10 MG/ML IV BOLUS
INTRAVENOUS | Status: DC | PRN
  Administered 2023-06-05: 150 ug/kg/min via INTRAVENOUS
  Administered 2023-06-05: 150 mg via INTRAVENOUS

## 2023-06-05 MED ORDER — LIDOCAINE HCL (PF) 2 % IJ SOLN
INTRAMUSCULAR | Status: AC
  Filled 2023-06-05: qty 5

## 2023-06-05 MED ORDER — DOXYCYCLINE HYCLATE 100 MG PO TABS
100.0000 mg | ORAL_TABLET | Freq: Once | ORAL | Status: AC
  Administered 2023-06-05: 100 mg via ORAL
  Filled 2023-06-05: qty 1

## 2023-06-05 MED ORDER — OXYCODONE HCL 5 MG/5ML PO SOLN: 5.0000 mg | Freq: Once | ORAL | Status: AC | PRN

## 2023-06-05 MED ORDER — ORAL CARE MOUTH RINSE: 15.0000 mL | Freq: Once | OROMUCOSAL | Status: AC

## 2023-06-05 MED ORDER — MIDAZOLAM HCL 2 MG/2ML IJ SOLN
INTRAMUSCULAR | Status: DC | PRN
Start: 2023-06-05 — End: 2023-06-05
  Administered 2023-06-05: 2 mg via INTRAVENOUS

## 2023-06-05 MED ORDER — 0.9 % SODIUM CHLORIDE (POUR BTL) OPTIME
TOPICAL | Status: DC | PRN
Start: 2023-06-05 — End: 2023-06-05
  Administered 2023-06-05: 500 mL

## 2023-06-05 MED ORDER — SODIUM CHLORIDE (PF) 0.9 % IJ SOLN
INTRAMUSCULAR | Status: AC
  Filled 2023-06-05: qty 20

## 2023-06-05 MED ORDER — ONDANSETRON HCL 4 MG/2ML IJ SOLN
INTRAMUSCULAR | Status: DC | PRN
  Administered 2023-06-05: 4 mg via INTRAVENOUS

## 2023-06-05 MED ORDER — ACETAMINOPHEN 10 MG/ML IV SOLN: 1000.0000 mg | Freq: Once | INTRAVENOUS | Status: DC | PRN

## 2023-06-05 MED ORDER — LIDOCAINE HCL (CARDIAC) PF 100 MG/5ML IV SOSY
PREFILLED_SYRINGE | INTRAVENOUS | Status: DC | PRN
  Administered 2023-06-05: 100 mg via INTRAVENOUS

## 2023-06-05 MED ORDER — OXYCODONE HCL 5 MG PO TABS
5.0000 mg | ORAL_TABLET | Freq: Once | ORAL | Status: AC | PRN
  Administered 2023-06-05: 5 mg via ORAL

## 2023-06-05 MED ORDER — FENTANYL CITRATE (PF) 100 MCG/2ML IJ SOLN
25.0000 ug | INTRAMUSCULAR | Status: DC | PRN
Start: 2023-06-05 — End: 2023-06-05

## 2023-06-05 MED ORDER — ACETAMINOPHEN 500 MG PO TABS
1000.0000 mg | ORAL_TABLET | ORAL | Status: AC
  Administered 2023-06-05: 1000 mg via ORAL

## 2023-06-05 MED ORDER — FENTANYL CITRATE (PF) 100 MCG/2ML IJ SOLN
INTRAMUSCULAR | Status: DC | PRN
  Administered 2023-06-05: 50 ug via INTRAVENOUS

## 2023-06-05 MED ORDER — GLYCOPYRROLATE 0.2 MG/ML IJ SOLN
INTRAMUSCULAR | Status: DC | PRN
  Administered 2023-06-05: .2 mg via INTRAVENOUS

## 2023-06-05 MED ORDER — CHLORHEXIDINE GLUCONATE 0.12 % MT SOLN
OROMUCOSAL | Status: AC
  Filled 2023-06-05: qty 15

## 2023-06-05 MED ORDER — FENTANYL CITRATE (PF) 100 MCG/2ML IJ SOLN
INTRAMUSCULAR | Status: AC
  Filled 2023-06-05: qty 2

## 2023-06-05 MED ORDER — DOXYCYCLINE HYCLATE 100 MG PO TABS
200.0000 mg | ORAL_TABLET | Freq: Once | ORAL | Status: AC
  Administered 2023-06-05: 200 mg via ORAL
  Filled 2023-06-05: qty 2

## 2023-06-05 MED ORDER — ONDANSETRON HCL 4 MG/2ML IJ SOLN
INTRAMUSCULAR | Status: AC
  Filled 2023-06-05: qty 2

## 2023-06-05 MED ORDER — POVIDONE-IODINE 10 % EX SWAB
2.0000 | Freq: Once | CUTANEOUS | Status: AC
  Administered 2023-06-05: 2 via TOPICAL

## 2023-06-05 MED ORDER — ONDANSETRON HCL 4 MG/2ML IJ SOLN: 4.0000 mg | Freq: Once | INTRAMUSCULAR | Status: DC | PRN

## 2023-06-05 MED ORDER — DEXAMETHASONE SODIUM PHOSPHATE 10 MG/ML IJ SOLN
INTRAMUSCULAR | Status: AC
  Filled 2023-06-05: qty 1

## 2023-06-05 MED ORDER — KETOROLAC TROMETHAMINE 30 MG/ML IJ SOLN
INTRAMUSCULAR | Status: AC
  Filled 2023-06-05: qty 1

## 2023-06-05 MED ORDER — KETOROLAC TROMETHAMINE 30 MG/ML IJ SOLN
INTRAMUSCULAR | Status: DC | PRN
  Administered 2023-06-05: 30 mg via INTRAVENOUS

## 2023-06-05 MED ORDER — DEXAMETHASONE SODIUM PHOSPHATE 10 MG/ML IJ SOLN
INTRAMUSCULAR | Status: DC | PRN
Start: 2023-06-05 — End: 2023-06-05
  Administered 2023-06-05: 10 mg via INTRAVENOUS

## 2023-06-05 MED ORDER — SILVER NITRATE-POT NITRATE 75-25 % EX MISC
CUTANEOUS | Status: AC
Start: 2023-06-05 — End: ?
  Filled 2023-06-05: qty 10

## 2023-06-05 MED ORDER — METHYLERGONOVINE MALEATE 0.2 MG/ML IJ SOLN
INTRAMUSCULAR | Status: AC
  Filled 2023-06-05: qty 1

## 2023-06-05 MED ORDER — CHLORHEXIDINE GLUCONATE 0.12 % MT SOLN
15.0000 mL | Freq: Once | OROMUCOSAL | Status: AC
  Administered 2023-06-05: 15 mL via OROMUCOSAL

## 2023-06-05 MED ORDER — ACETAMINOPHEN 500 MG PO TABS
ORAL_TABLET | ORAL | Status: AC
  Filled 2023-06-05: qty 2

## 2023-06-05 MED ORDER — MIDAZOLAM HCL 2 MG/2ML IJ SOLN
INTRAMUSCULAR | Status: AC
  Filled 2023-06-05: qty 2

## 2023-06-05 MED ORDER — VASOPRESSIN 20 UNIT/ML IV SOLN
INTRAVENOUS | Status: AC
  Filled 2023-06-05: qty 1

## 2023-06-05 MED ORDER — OXYCODONE HCL 5 MG PO TABS
ORAL_TABLET | ORAL | Status: AC
  Filled 2023-06-05: qty 1

## 2023-06-05 SURGICAL SUPPLY — 30 items
DRSG TELFA 3X8 NADH STRL (GAUZE/BANDAGES/DRESSINGS) IMPLANT
FILTER UTR ASPR SPEC (MISCELLANEOUS) ×1 IMPLANT
FLTR UTR ASPR SPEC (MISCELLANEOUS) ×1 IMPLANT
GAUZE 4X4 16PLY ~~LOC~~+RFID DBL (SPONGE) IMPLANT
GLOVE BIO SURGEON STRL SZ7 (GLOVE) ×1 IMPLANT
GLOVE BIOGEL PI IND STRL 7.5 (GLOVE) ×1 IMPLANT
GOWN STRL REUS W/ TWL LRG LVL3 (GOWN DISPOSABLE) ×2 IMPLANT
HANDLE YANKAUER SUCT BULB TIP (MISCELLANEOUS) IMPLANT
KIT BERKELEY 1ST TRIMESTER 3/8 (MISCELLANEOUS) ×1 IMPLANT
KIT TURNOVER CYSTO (KITS) ×1 IMPLANT
MANIFOLD NEPTUNE II (INSTRUMENTS) ×1 IMPLANT
NDL HYPO 22X1.5 SAFETY MO (MISCELLANEOUS) IMPLANT
NEEDLE HYPO 22X1.5 SAFETY MO (MISCELLANEOUS) IMPLANT
PACK DNC HYST (MISCELLANEOUS) ×1 IMPLANT
PAD OB MATERNITY 11 LF (PERSONAL CARE ITEMS) ×1 IMPLANT
PAD PREP OB/GYN DISP 24X41 (PERSONAL CARE ITEMS) ×1 IMPLANT
SCRUB CHG 4% DYNA-HEX 4OZ (MISCELLANEOUS) ×1 IMPLANT
SET BERKELEY SUCTION TUBING (SUCTIONS) ×1 IMPLANT
SET CYSTO W/LG BORE CLAMP LF (SET/KITS/TRAYS/PACK) IMPLANT
SOL PREP PVP 2OZ (MISCELLANEOUS) IMPLANT
SOLUTION PREP PVP 2OZ (MISCELLANEOUS) ×1 IMPLANT
SYR 10ML LL (SYRINGE) IMPLANT
TOWEL OR 17X26 4PK STRL BLUE (TOWEL DISPOSABLE) ×1 IMPLANT
TRAP FLUID SMOKE EVACUATOR (MISCELLANEOUS) ×1 IMPLANT
VACURETTE 10 RIGID CVD (CANNULA) IMPLANT
VACURETTE 6 ASPIR F TIP BERK (CANNULA) IMPLANT
VACURETTE 7MM F TIP STRL (CANNULA) IMPLANT
VACURETTE 8 RIGID CVD (CANNULA) IMPLANT
VACURETTE 8MM F TIP (MISCELLANEOUS) ×1 IMPLANT
WATER STERILE IRR 500ML POUR (IV SOLUTION) ×1 IMPLANT

## 2023-06-05 NOTE — Anesthesia Preprocedure Evaluation (Addendum)
 Anesthesia Evaluation  Patient identified by MRN, date of birth, ID band Patient awake    Reviewed: Allergy & Precautions, NPO status , Patient's Chart, lab work & pertinent test results  History of Anesthesia Complications Negative for: history of anesthetic complications  Airway Mallampati: I   Neck ROM: Full    Dental no notable dental hx.    Pulmonary asthma , former smoker (quit 2023, current vaping)   Pulmonary exam normal breath sounds clear to auscultation       Cardiovascular Exercise Tolerance: Good Normal cardiovascular exam Rhythm:Regular Rate:Normal  Echo 12/26/21:  NORMAL LEFT VENTRICULAR SYSTOLIC FUNCTION  NORMAL RIGHT VENTRICULAR SYSTOLIC FUNCTION  TRIVIAL REGURGITATION NOTED   NO VALVULAR STENOSIS    Neuro/Psych  PSYCHIATRIC DISORDERS Anxiety Depression       GI/Hepatic negative GI ROS,,,  Endo/Other  negative endocrine ROS    Renal/GU negative Renal ROS     Musculoskeletal Ehlers-Danlos   Abdominal   Peds  Hematology negative hematology ROS (+)   Anesthesia Other Findings   Reproductive/Obstetrics Missed abortion, 8 weeks                             Anesthesia Physical Anesthesia Plan  ASA: 2  Anesthesia Plan: General   Post-op Pain Management:    Induction: Intravenous  PONV Risk Score and Plan: 3 and Ondansetron, Dexamethasone and Treatment may vary due to age or medical condition  Airway Management Planned: LMA  Additional Equipment:   Intra-op Plan:   Post-operative Plan: Extubation in OR  Informed Consent: I have reviewed the patients History and Physical, chart, labs and discussed the procedure including the risks, benefits and alternatives for the proposed anesthesia with the patient or authorized representative who has indicated his/her understanding and acceptance.     Dental advisory given  Plan Discussed with: CRNA  Anesthesia Plan  Comments: (Patient consented for risks of anesthesia including but not limited to:  - adverse reactions to medications - damage to eyes, teeth, lips or other oral mucosa - nerve damage due to positioning  - sore throat or hoarseness - damage to heart, brain, nerves, lungs, other parts of body or loss of life  Informed patient about role of CRNA in peri- and intra-operative care.  Patient voiced understanding.)        Anesthesia Quick Evaluation

## 2023-06-05 NOTE — Transfer of Care (Signed)
 Immediate Anesthesia Transfer of Care Note  Patient: Sherry Salas  Procedure(s) Performed: DILATION AND EVACUATION, UTERUS  Patient Location: PACU  Anesthesia Type:General  Level of Consciousness: awake  Airway & Oxygen Therapy: Patient Spontanous Breathing  Post-op Assessment: Report given to RN and Post -op Vital signs reviewed and stable  Post vital signs: Reviewed and stable  Last Vitals:  Vitals Value Taken Time  BP    Temp    Pulse 96 06/05/23 1153  Resp 25 06/05/23 1153  SpO2 99 % 06/05/23 1153  Vitals shown include unfiled device data.  Last Pain:  Vitals:   06/05/23 0834  TempSrc:   PainSc: 0-No pain         Complications: There were no known notable events for this encounter.

## 2023-06-05 NOTE — Discharge Instructions (Signed)
Discharge instructions after a Dilation and Curettage ° °Signs and Symptoms to Report ° °Call our office at [phone] if you have any of the following:  ° °• Fever over 100.4 degrees or higher °• Severe stomach pain not relieved with pain medications °• Bright red bleeding that’s heavier than a period that does not slow with rest after the first 24 hours °• To go the bathroom a lot (frequency), you can’t hold your urine (urgency), or it hurts when you empty your bladder (urinate) °• Chest pain °• Shortness of breath °• Pain in the calves of your legs °• Severe nausea and vomiting not relieved with anti-nausea medications °• Any concerns ° °What You Can Expect after Surgery °• You may see some pink tinged, bloody fluid. This is normal. You may also have cramping for several days.  ° °Activities after Your Discharge °Follow these guidelines to help speed your recovery at home: °• Don’t drive if you are in pain or taking narcotic pain medicine. You may drive when you can safely slam on the brakes, turn the wheel forcefully, and rotate your torso comfortably. This is typically 4-7 days. Practice in a parking lot or side street prior to attempting to drive regularly.  °• Ask others to help with household chores until you feel up to doing tasks. °• Don’t do strenuous activities, exercises, or sports like vacuuming, tennis, squash, etc. until your doctor says it is safe to do so. °• Walk as you feel able. Rest often since it may take a week or two for your energy level to return to normal.  °• You may climb stairs °• Avoid constipation: °  -Eat fruits, vegetables, and whole grains. Eat small meals as your appetite will take time to return to normal. °  -Drink 6 to 8 glasses of water each day unless your doctor has told you to limit your fluids. °  -Use a laxative or stool softener as needed if constipation becomes a problem. You may take Miralax, metamucil, Citrucil, Colace, Senekot, FiberCon, etc. If this does not  relieve the constipation, try two tablespoons of Milk Of Magnesia every 8 hours until your bowels move.  °• You may shower.  °• Do not get in a hot tub, swimming pool, etc. until your doctor agrees. °• Do not douche, use tampons, or have sex until you stop spotting, usually about 2 weeks. °• Take your pain medicine when you need it. The medicine may not work as well if the pain is bad. ° °Take the medicines you were taking before surgery. Other medications you might need are pain medications (ibuprofen), medications for constipation (Colace) and nausea medications (Zofran).  ° ° ° ° ° °Coping with Pregnancy Loss °Pregnancy loss can happen any time during a pregnancy. Often the cause is not known. It is rarely because of anything you did. Pregnancy loss in early pregnancy (during the first trimester) is called a miscarriage. This type of pregnancy loss is the most common.  ° °Any pregnancy loss can be devastating. You will need to recover both physically and emotionally. Most women are able to get pregnant again after a pregnancy loss and deliver a healthy baby. ° °How to manage emotional recovery ° °Pregnancy loss is very hard emotionally. You may feel many different emotions while you grieve. You may feel sad and angry. You may also feel guilty. It is normal to have periods of crying. Emotional recovery can take longer than physical recovery. It is different for everyone.   Some women may feel back to normal quickly and others take longer. °Taking these steps can help you cope: °· Remember that it is unlikely you did anything to cause the pregnancy loss. °· Share your thoughts and feelings with friends, family, and your partner. Remember that your partner is also recovering emotionally. °· Make sure you have a good support system, and do not spend too much time alone. °· Meet with a pregnancy loss counselor or join a pregnancy loss support group. °· Get enough sleep and eat a healthy diet. Return to regular exercise  when you have recovered physically. °· Do not use drugs or alcohol to manage your emotions. °· Consider seeing a mental health professional to help you recover emotionally. °· Ask a friend or loved one to help you decide what to do with any clothing and nursery items you received for your baby. ° °How to recognize emotional stress °It is normal to have emotional stress after a pregnancy loss. But emotional stress that lasts a long time or becomes severe requires treatment. Watch out for these signs of severe emotional stress: °· Sadness, anger, or guilt that is not going away and is interfering with your normal activities. °· Relationship problems that have occurred or gotten worse since the pregnancy loss. °· Signs of depression that last longer than 2 weeks. These may include: °? Sadness. °? Anxiety. °? Hopelessness. °? Loss of interest in activities you enjoy. °? Inability to concentrate. °? Trouble sleeping or sleeping too much. °? Loss of appetite or overeating. °? Thoughts of death or of hurting yourself. °Follow these instructions at home: °Medicines °· Take over-the-counter and prescription medicines only as told by your health care provider. °Activity °· Rest at home until your energy level returns. Return to your normal activities as told by your health care provider. Ask your health care provider what activities are safe for you. °General instructions °· Keep all follow-up visits as told by your health care provider. This is important. °· It may be helpful to meet with others who have experienced pregnancy loss. Ask your health care provider about support groups and resources. °· To help you and your partner with the process of grieving, talk with your health care provider or seek counseling. °· When you are ready, meet with your health care provider to discuss steps to take for a future pregnancy. °Where to find more information °· [place] Department of Health and Human Services Office on Women's Health:  www.womenshealth.gov °· American Pregnancy Association: www.americanpregnancy.org °Contact a health care provider if: °· You continue to experience grief, sadness, or lack of motivation for everyday activities, and those feelings do not improve over time. °· You are struggling to recover emotionally, especially if you are using alcohol or substances to help. °Get help right away if: °· You have thoughts of hurting yourself or others. °If you ever feel like you may hurt yourself or others, or have thoughts about taking your own life, get help right away. You can go to your nearest emergency department or call: °· Your local emergency services (911 in the [place] °· A suicide crisis helpline, such as the National Suicide Prevention Lifeline at 1-800-273-8255. This is open 24 hours a day. °Summary °· Any pregnancy loss can be difficult physically and emotionally. °· You may experience many different emotions while you grieve. Emotional recovery can last longer than physical recovery. °· It is normal to have emotional stress after a pregnancy loss. But emotional stress that lasts a   long time or becomes severe requires treatment. °· See your health care provider if you are struggling emotionally after a pregnancy loss. °This information is not intended to replace advice given to you by your health care provider. Make sure you discuss any questions you have with your health care provider. °Document Released: 05/28/2017 Document Revised: 05/28/2017 Document Reviewed: 05/28/2017 °Elsevier Interactive Patient Education © 2019 [employer] ° ° °

## 2023-06-05 NOTE — Anesthesia Postprocedure Evaluation (Signed)
 Anesthesia Post Note  Patient: Barrister's clerk  Procedure(s) Performed: DILATION AND EVACUATION, UTERUS (Uterus)  Patient location during evaluation: PACU Anesthesia Type: General Level of consciousness: awake and alert, oriented and patient cooperative Pain management: pain level controlled Vital Signs Assessment: post-procedure vital signs reviewed and stable Respiratory status: spontaneous breathing, nonlabored ventilation and respiratory function stable Cardiovascular status: blood pressure returned to baseline and stable Postop Assessment: adequate PO intake Anesthetic complications: no   There were no known notable events for this encounter.   Last Vitals:  Vitals:   06/05/23 1220 06/05/23 1231  BP:  119/78  Pulse: 62 62  Resp: (!) 8 12  Temp:    SpO2: 100% 100%    Last Pain:  Vitals:   06/05/23 1220  TempSrc:   PainSc: 4                  Reed Breech

## 2023-06-05 NOTE — Interval H&P Note (Signed)
 History and Physical Interval Note:  06/05/2023 10:59 AM  Sherry Salas  has presented today for surgery, with the diagnosis of Incomplete abortion failed medical management.  The various methods of treatment have been discussed with the patient and family. After consideration of risks, benefits and other options for treatment, the patient has consented to  Procedure(s) with comments: DILATION AND EVACUATION, UTERUS (N/A) - Suction D&C with fetal chromosomes ([facility] or [facility]) as a surgical intervention.  The patient's history has been reviewed, patient examined, no change in status, stable for surgery.  I have reviewed the patient's chart and labs.  Questions were answered to the patient's satisfaction.     Christeen Douglas

## 2023-06-05 NOTE — Anesthesia Procedure Notes (Signed)
 Procedure Name: LMA Insertion Date/Time: 06/05/2023 11:14 AM  Performed by: Lysbeth Penner, CRNAPre-anesthesia Checklist: Patient identified, Emergency Drugs available, Suction available, Patient being monitored and Timeout performed Patient Re-evaluated:Patient Re-evaluated prior to induction Oxygen Delivery Method: Circle system utilized Preoxygenation: Pre-oxygenation with 100% oxygen Induction Type: IV induction LMA: LMA inserted LMA Size: 3.0 Number of attempts: 1 Tube secured with: Tape Dental Injury: Teeth and Oropharynx as per pre-operative assessment

## 2023-06-05 NOTE — Op Note (Addendum)
 Operative Report Suction Dilation and Curettage   Indications: Incomplete abortion, recurrent pregnancy loss  Pre-operative Diagnosis: Incomplete abortion at 6 weeks  Post-operative Diagnosis: same.  Procedure: 1. Suction D&C  Surgeon: Christeen Douglas, MD  Assistant(s):  None  Anesthesia: General LMA anesthesia  Anesthesiologist: Reed Breech, MD Anesthesiologist: Reed Breech, MD CRNA: Lysbeth Penner, CRNA  Estimated Blood Loss:  less than 50 mL               Total IV Fluids:  Urine Output: 25ml         Specimens: Products of conception; chromosome microarray through [facility] kit         Complications:  None; patient tolerated the procedure well.         Disposition: PACU - hemodynamically stable.         Condition: stable  Findings: Uterus measuring 6 weeks; normal cervix, vagina, perineum.   Indication for procedure/Consents: 38 y.o. [id number]  here for scheduled surgery for the aforementioned diagnoses.  Risks of surgery were discussed with the patient including but not limited to: bleeding which may require transfusion; infection which may require antibiotics; injury to uterus or surrounding organs; intrauterine scarring which may impair future fertility; need for additional procedures including laparotomy or laparoscopy; and other postoperative/anesthesia complications. Written informed consent was obtained.    Procedure Details:   The patient received oral antibiotics while in the preoperative area.  She was then taken to the operating room where general anesthesia was administered and was found to be adequate.  After a formal and adequate timeout was performed, she was placed in the dorsal lithotomy position and examined with the above findings. She was then prepped and draped in the sterile manner.   Her bladder was catheterized for an estimated amount of clear, yellow urine. A speculum was then placed in the patient's vagina and a single tooth tenaculum  was applied to the anterior lip of the cervix.    No uterine sounding was performed on this pregnant uterus. Her cervix was serially dilated to accommodate a 6 sized flexible suction curette.  A sharp curettage was then gently performed until there was a gritty texture in all four quadrants.  The tenaculum was removed from the anterior lip of the cervix and the vaginal speculum was removed after noting good hemostasis. The patient tolerated the procedure well and was taken to the recovery area awake, extubated and in stable condition.  The patient will be discharged to home as per PACU criteria.  She will receive another dose of oral antibiotics prior to discharge. Routine postoperative instructions given.  She was prescribed Percocet, Ibuprofen and Colace.  She will follow up in the clinic in two weeks for postoperative evaluation.

## 2023-06-05 NOTE — Interval H&P Note (Signed)
 History and Physical Interval Note:  06/05/2023 10:36 AM  Sherry Salas  has presented today for surgery, with the diagnosis of Incomplete abortion failed medical management.  The various methods of treatment have been discussed with the patient and family. After consideration of risks, benefits and other options for treatment, the patient has consented to  Procedure(s) with comments: DILATION AND EVACUATION, UTERUS (N/A) - Suction D&C with fetal chromosomes ([facility] or [facility]) as a surgical intervention.  The patient's history has been reviewed, patient examined, no change in status, stable for surgery.  I have reviewed the patient's chart and labs.  Questions were answered to the patient's satisfaction.     Sherry Salas

## 2023-06-06 ENCOUNTER — Encounter: Payer: Self-pay | Admitting: Obstetrics and Gynecology

## 2023-06-08 LAB — SURGICAL PATHOLOGY

## 2023-07-16 ENCOUNTER — Telehealth: Payer: Self-pay

## 2023-07-16 NOTE — Telephone Encounter (Signed)
REFERRAL

## 2023-09-15 ENCOUNTER — Ambulatory Visit (HOSPITAL_BASED_OUTPATIENT_CLINIC_OR_DEPARTMENT_OTHER): Admitting: Maternal & Fetal Medicine

## 2023-09-15 ENCOUNTER — Ambulatory Visit: Attending: Obstetrics and Gynecology

## 2023-09-15 DIAGNOSIS — N96 Recurrent pregnancy loss: Secondary | ICD-10-CM

## 2023-09-15 DIAGNOSIS — Z8759 Personal history of other complications of pregnancy, childbirth and the puerperium: Secondary | ICD-10-CM | POA: Diagnosis not present

## 2023-09-15 DIAGNOSIS — Z3169 Encounter for other general counseling and advice on procreation: Secondary | ICD-10-CM

## 2023-09-15 NOTE — Progress Notes (Signed)
 MFM preconception consultation   Sherry Salas is a 38 yo G6 P1 who is here at the request of Dr. Prescilla Brod to discuss recurrent pregnancy loss.  She is doing well today with a regular menstrual cycle. She desires to pursue another pregnancy as early as possible.   Her pregnancy history includes: G1- 56 yo (17 years ago) IAB at 7 weeks D&E G2 22' 17 weeks IUFD - she reports that she became violently ill after words. She felt terrible with n/v and overall feeling poorly. G3- 22' 7 wk only positive pregnancy test only no ultrasound G4- 2023 [name] 40 week NSVD no complications G5 2024 -positive pregnancy test G6 2025 - 7 week D&C.  Her family history is significant for mother with TIA's several DVT's and PE.  She also has two maternal aunts who were never able to have children.  She has two cousins with multiple miscarriages but one has one child. She does not know her fathers history.   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      06/05/2023   12:31 PM 06/05/2023   12:20 PM 06/05/2023   12:15 PM  Vitals with BMI  Systolic 119  107  Diastolic 78  81  Pulse 62 62 76   Past Medical History:  Diagnosis Date   Asthma    as a child   Chickenpox    Depression    Ehlers-Danlos syndrome    Generalized anxiety disorder    Missed abortions    Past Surgical History:  Procedure Laterality Date   BREAST REDUCTION SURGERY Bilateral 2009   DILATION AND EVACUATION N/A 06/05/2023   Procedure: DILATION AND EVACUATION, UTERUS;  Surgeon: Prescilla Brod, MD;  Location: [facility] OR[facility];  Service: Gynecology;  Laterality: N/A;  Suction D&C with fetal chromosomes ([facility] or [facility])   INDUCED ABORTION  2009   Family History  Problem Relation Age of Onset   Arthritis Mother    Stroke Mother    Depression Mother    Breast cancer Mother    Ehlers-Danlos syndrome Mother    Alcohol abuse Maternal Grandfather    Lung cancer Maternal Grandfather    Ehlers-Danlos syndrome  Maternal Aunt    Social History   Socioeconomic History   Marital status: Married    Spouse name: Sherry Salas   Number of children: 1   Years of education: Not on file   Highest education level: Not on file  Occupational History   Not on file  Tobacco Use   Smoking status: Former    Current packs/day: 0.50    Types: Cigarettes   Smokeless tobacco: Never   Tobacco comments:    8/23 stopped smoking  Vaping Use   Vaping status: Every Day   Substances: Nicotine  Substance and Sexual Activity   Alcohol use: Not Currently    Alcohol/week: 10.0 standard drinks of alcohol    Types: 10 Glasses of wine per week    Comment: Sober since 1/21   Drug use: Not Currently    Types: Marijuana   Sexual activity: Yes  Other Topics Concern   Not on file  Social History Narrative   Married.   Enjoys painting.    Social Drivers of Corporate investment banker Strain: Not on file  Food Insecurity: No Food Insecurity (03/13/2022)   Hunger Vital Sign    Worried About Running Out of Food in the Last Year: Never true    Ran Out of Food in the Last Year: Never  true  Transportation Needs: Not on file  Physical Activity: Not on file  Stress: Not on file  Social Connections: Not on file  Intimate Partner Violence: Not on file         Current Outpatient Medications (Other):    bisacodyl (DULCOLAX) 5 MG EC tablet, Take 5 mg by mouth daily as needed for moderate constipation.   Prenatal Vit-Fe Fumarate-FA (PRENATAL MULTIVITAMIN) TABS tablet, Take 1 tablet by mouth daily at 12 noon. Allergies  Allergen Reactions   Sulfa Antibiotics Other (See Comments)    Childhood   Shellfish Allergy Nausea And Vomiting and Rash    Shortness of breath   Impression/Counseling:  I discussed with Sherry Salas that I agreed with Dr. Lenward Railing note to assess the following evaluation: 1) Antiphospholipid antibodies 2) TSH 3) Fasting blood sugar 4) Intrauterine anatomy via saline infusion sonohystergram 5)  Factor V Ledien and prothrombin gene mutation.  I conveyed that given her clinical history of the 17 weeks loss I would strongly recommend evaluation for APLAS. (Antiphospholipid antibody syndrome).   We discussed the diagnosis, evaluation and management of this syndrome which includes an early clinical diagnosis (10 week  loss, mid trimester loss and or early preeclampsia). She also needs two elevations of APLAS antibody's (B2 glycoprotein, Anticardiolipin antibody and or lupus anticoagulant.)    We discussed the treatment includes Low dose ASA and Lovenox daily.  We also recommended meeting with an reproductive endocrinology infertility as in the next pregnancy they often have a protocol to care for women at risk for APLAS clinically.   We discussed that if her labs returned normal I would complete the evaluation as outlined by Dr. Alvia Awkward and I would recommend daily low dose ASA of 162 mg/day.   IF diagnosised with APLAS we recommend serial growth exam every 4 weeks with weekly testing beginning at 32 weeks as there is an increased risk for fetal growth restriction and stillbirth.  Sherry Salas conveyed her understanding of our meeting today and agreed with the plan.   The labs mentioned were not drawn today.  I spent 60 minutes with > 50% in face to face consultation and medical record review.  Sherry Derusha Garen Juneau, MD

## 2023-09-15 NOTE — Progress Notes (Signed)
 Pre conception v/s  Bp 119 87,  pulse 80

## 2023-10-19 DIAGNOSIS — Q7962 Hypermobile Ehlers-Danlos syndrome: Secondary | ICD-10-CM | POA: Diagnosis not present

## 2024-02-06 ENCOUNTER — Ambulatory Visit: Admission: EM | Admit: 2024-02-06 | Discharge: 2024-02-06 | Disposition: A | Discharge status: Home / Self Care

## 2024-02-06 ENCOUNTER — Encounter: Payer: Self-pay | Admitting: Emergency Medicine

## 2024-02-06 DIAGNOSIS — H6501 Acute serous otitis media, right ear: Secondary | ICD-10-CM | POA: Diagnosis not present

## 2024-02-06 DIAGNOSIS — J0100 Acute maxillary sinusitis, unspecified: Secondary | ICD-10-CM | POA: Diagnosis not present

## 2024-02-06 MED ORDER — AMOXICILLIN 500 MG PO CAPS: 500.0000 mg | ORAL_CAPSULE | Freq: Two times a day (BID) | ORAL | 0 refills | Status: AC

## 2024-02-06 NOTE — ED Triage Notes (Signed)
 Patient is in room 6. Patient reports facial pain, nasal congestion with green mucus, cough and left ear pain x 1 week.  Patient has been using saline spray, humidifier and steam baths with no relief. Rates facial pain 8/10 and ear pain 6/10. Patient is [redacted] weeks pregnant.

## 2024-02-06 NOTE — ED Provider Notes (Signed)
 [facility]AL[facility] PELT    CSN: [id number] Arrival date & time: 02/06/24  0946      History   Chief Complaint Chief Complaint  Patient presents with   Cough   Facial Pain   Otalgia   Nasal Congestion    HPI Sherry Salas is a 38 y.o. female.   Patient presents for evaluation of nasal congestion, a primarily nonproductive cough, left sided ear pain, sinus pressure and pain to the left side affecting the teeth and jaw present for 7 days.  Known sick contact in household.  Tolerable to food and liquids.  Has attempted use of saline nasal spray, humidifier and steam shower.  Currently [redacted] weeks pregnant.  Denies fever, shortness of breath or wheezing.   Past Medical History:  Diagnosis Date   Asthma    as a child   Chickenpox    Depression    Ehlers-Danlos syndrome    Generalized anxiety disorder    Missed abortions     Patient Active Problem List   Diagnosis Date Noted   NSVD (normal spontaneous vaginal delivery) 03/13/2022   Adjustment disorder with anxious mood 09/23/2021   Hair loss 06/17/2018   Palpitations 09/21/2015   Anxiety and depression 03/20/2015    Past Surgical History:  Procedure Laterality Date   BREAST REDUCTION SURGERY Bilateral 2009   DILATION AND EVACUATION N/A 06/05/2023   Procedure: DILATION AND EVACUATION, UTERUS;  Surgeon: Verdon Keen, MD;  Location: [facility] ORS;  Service: Gynecology;  Laterality: N/A;  Suction D&C with fetal chromosomes ([facility] or [facility])   INDUCED ABORTION  2009    OB History     Gravida  5   Para  1   Term  1   Preterm      AB  3   Living  1      SAB  2   IAB      Ectopic      Multiple  0   Live Births  1            Home Medications    Prior to Admission medications   Medication Sig Start Date End Date Taking? Authorizing Provider  aspirin EC 81 MG tablet Take 81 mg by mouth daily. Swallow whole.   Yes [provider]  progesterone (PROMETRIUM) 200 MG capsule Place 200 mg  vaginally daily. 01/21/24  Yes [provider]  bisacodyl (DULCOLAX) 5 MG EC tablet Take 5 mg by mouth daily as needed for moderate constipation.    [provider]  Prenatal Vit-Fe Fumarate-FA (PRENATAL MULTIVITAMIN) TABS tablet Take 1 tablet by mouth daily at 12 noon.    [provider]    Family History Family History  Problem Relation Age of Onset   Arthritis Mother    Stroke Mother    Depression Mother    Breast cancer Mother    Ehlers-Danlos syndrome Mother    Alcohol abuse Maternal Grandfather    Lung cancer Maternal Grandfather    Ehlers-Danlos syndrome Maternal Aunt     Social History Social History   Tobacco Use   Smoking status: Former    Current packs/day: 0.50    Types: Cigarettes   Smokeless tobacco: Never   Tobacco comments:    8/23 stopped smoking  Vaping Use   Vaping status: Former  Substance Use Topics   Alcohol use: Not Currently    Alcohol/week: 10.0 standard drinks of alcohol    Types: 10 Glasses of wine per week  Comment: Sober since 1/21   Drug use: Not Currently    Types: Marijuana     Allergies   Sulfa antibiotics and Shellfish allergy   Review of Systems Review of Systems  Constitutional: Negative.   HENT:  Positive for congestion, ear pain, sinus pressure and sinus pain. Negative for dental problem, drooling, ear discharge, facial swelling, hearing loss, mouth sores, nosebleeds, postnasal drip, rhinorrhea, sneezing, sore throat, tinnitus, trouble swallowing and voice change.   Respiratory:  Positive for cough. Negative for apnea, choking, chest tightness, shortness of breath, wheezing and stridor.   Cardiovascular: Negative.   Gastrointestinal: Negative.   Neurological: Negative.      Physical Exam Triage Vital Signs ED Triage Vitals  Encounter Vitals Group     BP 02/06/24 1037 112/72     Girls Systolic BP Percentile --      Girls Diastolic BP Percentile --      Boys Systolic BP Percentile --       Boys Diastolic BP Percentile --      Pulse Rate 02/06/24 1037 96     Resp 02/06/24 1037 18     Temp 02/06/24 1037 98.5 F (36.9 C)     Temp Source 02/06/24 1037 Oral     SpO2 02/06/24 1037 98 %     Weight --      Height --      Head Circumference --      Peak Flow --      Pain Score 02/06/24 1033 8     Pain Loc --      Pain Education --      Exclude from Growth Chart --    No data found.  Updated Vital Signs BP 112/72 (BP Location: Left Arm)   Pulse 96   Temp 98.5 F (36.9 C) (Oral)   Resp 18   LMP 03/13/2023 (Exact Date)   SpO2 98%   Visual Acuity Right Eye Distance:   Left Eye Distance:   Bilateral Distance:    Right Eye Near:   Left Eye Near:    Bilateral Near:     Physical Exam Constitutional:      Appearance: Normal appearance.  HENT:     Right Ear: Hearing, ear canal and external ear normal. Tympanic membrane is erythematous.     Left Ear: Hearing, tympanic membrane, ear canal and external ear normal.     Nose: Congestion present.     Right Sinus: No maxillary sinus tenderness or frontal sinus tenderness.     Left Sinus: No maxillary sinus tenderness or frontal sinus tenderness.     Mouth/Throat:     Pharynx: No oropharyngeal exudate or posterior oropharyngeal erythema.  Eyes:     Extraocular Movements: Extraocular movements intact.  Cardiovascular:     Rate and Rhythm: Normal rate and regular rhythm.     Pulses: Normal pulses.     Heart sounds: Normal heart sounds.  Pulmonary:     Effort: Pulmonary effort is normal.     Breath sounds: Normal breath sounds.  Lymphadenopathy:     Cervical: No cervical adenopathy.  Neurological:     Mental Status: She is alert and oriented to person, place, and time. Mental status is at baseline.      UC Treatments / Results  Labs (all labs ordered are listed, but only abnormal results are displayed) Labs Reviewed - No data to display  EKG   Radiology No results found.  Procedures Procedures (including  critical care time)  Medications Ordered in UC Medications - No data to display  Initial Impression / Assessment and Plan / UC Course  I have reviewed the triage vital signs and the nursing notes.  Pertinent labs & imaging results that were available during my care of the patient were reviewed by me and considered in my medical decision making (see chart for details).  Acute nonrecurrent maxillary sinusitis, nonrecurrent acute serous otitis media of the right ear  Vital signs stable, patient in no signs of distress nontoxic-appearing, symptomology consistent with a sinusitis, erythema present to the right tympanic membrane indicating infection, initiating antibiotics for treatment, prescribed amoxicillin, discussed administration, given written handout of safe medications to use during pregnancy, recommended nonpharmacological supportive care and advised follow-up for persisting or worsening symptoms Final Clinical Impressions(s) / UC Diagnoses   Final diagnoses:  None   Discharge Instructions   None    ED Prescriptions   None    PDMP not reviewed this encounter.   Teresa Shelba SAUNDERS, NP 02/06/24 1053

## 2024-02-06 NOTE — Discharge Instructions (Addendum)
 Today you are being treated for a sinus infection as well as a right sided ear infection  Take amoxicillin twice daily for 10 days for treatment  Inside your back is a list of medications safe for you to use during pregnancy    You can take Tylenol  a as needed for fever reduction and pain relief.   For cough: honey 1/2 to 1 teaspoon (you can dilute the honey in water or another fluid).  You can use a humidifier for chest congestion and cough.  If you don't have a humidifier, you can sit in the bathroom with the hot shower running.      For sore throat: try warm salt water gargles, cepacol lozenges, throat spray, warm tea or water with lemon/honey, popsicles or ice, or OTC cold relief medicine for throat discomfort.   For congestion: take a daily anti-histamine like Zyrtec, Claritin, uou can also use Flonase 1-2 sprays in each nostril daily.   It is important to stay hydrated: drink plenty of fluids (water, gatorade/powerade/pedialyte, juices, or teas) to keep your throat moisturized and help further relieve irritation/discomfort.

## 2024-02-19 DIAGNOSIS — N96 Recurrent pregnancy loss: Secondary | ICD-10-CM | POA: Diagnosis not present

## 2024-02-19 DIAGNOSIS — N912 Amenorrhea, unspecified: Secondary | ICD-10-CM | POA: Diagnosis not present

## 2024-02-19 DIAGNOSIS — O039 Complete or unspecified spontaneous abortion without complication: Secondary | ICD-10-CM | POA: Diagnosis not present

## 2024-03-03 NOTE — H&P (Signed)
 Ms. Julien Berryman is a 38 y.o. female [phone] here for Follow-up (U/s follow up - bleeding )     Pt story:   Recurrent pregnancy loss Last pregnancy 01/2024 - no further workup desired, she is done. Considering vasectomy   G1- 34 yo (17 years ago) IAB at 7 weeks D&E G2 22' 17 weeks IUFD - she reports that she became violently ill after words. She felt terrible with n/v and overall feeling poorly. G3- 22' 7 wk only positive pregnancy test only no ultrasound G4- 2023 little girl (Heike) 40 week NSVD no complications AND SHE'S PERFECT   G5 2024 -positive pregnancy test G6 2025 - 7 week D&C.  G7 2025 - 7wk misoprostol  - this pregnancy   Imaging: 7mm fetus without cardiac activity Bleeding starting      Exam:    Ht 170.2 cm (5' 7)   LMP 12/24/2023 (Exact Date)   BMI 25.22 kg/m    Constitutional:  General appearance: Well nourished, well developed female in no acute distress.  Neuro/psych:  Normal mood and affect. No gross motor deficits. Neck:  Supple, normal appearance.  Respiratory:  Normal respiratory effort, no use of accessory muscles Skin:  No visible rashes or external lesions   Pelvic:  deferred   Impression:    The primary encounter diagnosis was Recurrent pregnancy loss. A diagnosis of SAB (spontaneous abortion) ([facility]) was also pertinent to this visit.   Plan:    Spontaneous miscarriage: Causes of spontaneous miscarriage discussed with patient, including prevalence, common causes, and the expectation that this event does not increase the chance that she will miscarry again in the future. Emotional support given.   Management options discussed, including: Expectant, medical and surgical.   Pt has opted for: medical, which did not complete miscarriage. We will proceed to surgical management.   Consents signed today. Risks of surgery were discussed with the patient including but not limited to: bleeding which may require transfusion; infection which may  require antibiotics; injury to uterus or surrounding organs; intrauterine scarring which may impair future fertility; need for additional procedures including laparotomy or laparoscopy; and other postoperative/anesthesia complications. Written informed consent was obtained.  This is a scheduled same-day surgery. She will have a postop visit in 2 weeks to review operative findings and pathology.

## 2024-03-04 ENCOUNTER — Other Ambulatory Visit: Payer: Self-pay

## 2024-03-04 ENCOUNTER — Inpatient Hospital Stay
Admission: RE | Admit: 2024-03-04 | Discharge: 2024-03-04 | Attending: Obstetrics and Gynecology | Admitting: Obstetrics and Gynecology

## 2024-03-04 DIAGNOSIS — Z01812 Encounter for preprocedural laboratory examination: Secondary | ICD-10-CM

## 2024-03-04 DIAGNOSIS — O039 Complete or unspecified spontaneous abortion without complication: Secondary | ICD-10-CM

## 2024-03-04 HISTORY — DX: Complete or unspecified spontaneous abortion without complication: O03.9

## 2024-03-04 NOTE — Patient Instructions (Addendum)
 Your procedure is scheduled on: 03/07/24 - Monday Report to the Registration Desk on the 1st floor of the [facility]. To find out your arrival time, please call [phone] between 1PM - 3PM on: 03/04/24 - Friday If your arrival time is 6:00 am, do not arrive before that time as the [facility] entrance doors do not open until 6:00 am.  REMEMBER: Instructions that are not followed completely may result in serious medical risk, up to and including death; or upon the discretion of your surgeon and anesthesiologist your surgery may need to be rescheduled.  Do not eat food after midnight the night before surgery.  No gum chewing or hard candies.  You may however, drink CLEAR liquids up to 2 hours before you are scheduled to arrive for your surgery. Do not drink anything within 2 hours of your scheduled arrival time.  Clear liquids include: - water  - apple juice without pulp - gatorade (not RED colors) - black coffee or tea (Do NOT add milk or creamers to the coffee or tea) Do NOT drink anything that is not on this list.  One week prior to surgery: Stop Anti-inflammatories (NSAIDS) such as Advil , Aleve, Ibuprofen , Motrin , Naproxen, Naprosyn and Aspirin based products such as Excedrin, Goody's Powder, BC Powder. You may take Tylenol  if needed for pain up until the day of surgery.  Stop ANY OVER THE COUNTER supplements until after surgery.  ON THE DAY OF SURGERY ONLY TAKE THESE MEDICATIONS WITH SIPS OF WATER:  none   No Alcohol for 24 hours before or after surgery.  No Smoking including e-cigarettes for 24 hours before surgery.  No chewable tobacco products for at least 6 hours before surgery.  No nicotine patches on the day of surgery.  Do not use any recreational drugs for at least a week (preferably 2 weeks) before your surgery.  Please be advised that the combination of cocaine and anesthesia may have negative outcomes, up to and including death. If you test positive for  cocaine, your surgery will be cancelled.  On the morning of surgery brush your teeth with toothpaste and water, you may rinse your mouth with mouthwash if you wish. Do not swallow any toothpaste or mouthwash.  Do not wear jewelry, make-up, hairpins, clips or nail polish.  For welded (permanent) jewelry: bracelets, anklets, waist bands, etc.  Please have this removed prior to surgery.  If it is not removed, there is a chance that hospital personnel will need to cut it off on the day of surgery.  Do not wear lotions, powders, or perfumes.   Do not shave body hair from the neck down 48 hours before surgery.  Contact lenses, hearing aids and dentures may not be worn into surgery.  Do not bring valuables to the hospital. [facility] is not responsible for any missing/lost belongings or valuables.   Notify your doctor if there is any change in your medical condition (cold, fever, infection).  Wear comfortable clothing (specific to your surgery type) to the hospital.  After surgery, you can help prevent lung complications by doing breathing exercises.  Take deep breaths and cough every 1-2 hours. Your doctor may order a device called an Incentive Spirometer to help you take deep breaths. If you are being admitted to the hospital overnight, leave your suitcase in the car. After surgery it may be brought to your room.  In case of increased patient census, it may be necessary for you, the patient, to continue your postoperative  care in the Same Day Surgery department.  If you are being discharged the day of surgery, you will not be allowed to drive home. You will need a responsible individual to drive you home and stay with you for 24 hours after surgery.   If you are taking public transportation, you will need to have a responsible individual with you.  Please call the Pre-admissions Testing Dept. at [phone] if you have any questions about these instructions.  Surgery Visitation  Policy:  Patients having surgery or a procedure may have two visitors.  Children under the age of 62 must have an adult with them who is not the patient.  Inpatient Visitation:    Visiting hours are 7 a.m. to 8 p.m. Up to four visitors are allowed at one time in a patient room. The visitors may rotate out with other people during the day.  One visitor age 52 or older may stay with the patient overnight and must be in the room by 8 p.m.   Merchandiser, Retail to address health-related social needs:  [email or web address]

## 2024-03-05 NOTE — Anesthesia Preprocedure Evaluation (Signed)
 Anesthesia Evaluation  Patient identified by MRN, date of birth, ID band Patient awake    Reviewed: Allergy & Precautions, NPO status , Patient's Chart, lab work & pertinent test results  History of Anesthesia Complications Negative for: history of anesthetic complications  Airway Mallampati: I   Neck ROM: Full    Dental no notable dental hx.    Pulmonary asthma , Patient abstained from smoking., former smoker   Pulmonary exam normal breath sounds clear to auscultation       Cardiovascular Exercise Tolerance: Good Normal cardiovascular exam Rhythm:Regular Rate:Normal  Echo 12/26/21:  NORMAL LEFT VENTRICULAR SYSTOLIC FUNCTION  NORMAL RIGHT VENTRICULAR SYSTOLIC FUNCTION  TRIVIAL REGURGITATION NOTED   NO VALVULAR STENOSIS    Neuro/Psych  PSYCHIATRIC DISORDERS Anxiety Depression       GI/Hepatic negative GI ROS,,,  Endo/Other  negative endocrine ROS    Renal/GU negative Renal ROS     Musculoskeletal Ehlers-Danlos   Abdominal   Peds  Hematology negative hematology ROS (+)   Anesthesia Other Findings   Reproductive/Obstetrics Missed abortion, 8 weeks                              Anesthesia Physical Anesthesia Plan  ASA: 2  Anesthesia Plan: General   Post-op Pain Management: Tylenol  PO (pre-op)* and Celebrex  PO (pre-op)*   Induction: Intravenous  PONV Risk Score and Plan: 3 and Ondansetron , Propofol  infusion and TIVA  Airway Management Planned: Natural Airway  Additional Equipment:   Intra-op Plan:   Post-operative Plan:   Informed Consent:      Dental advisory given  Plan Discussed with: CRNA  Anesthesia Plan Comments:          Anesthesia Quick Evaluation

## 2024-03-06 MED ORDER — CELECOXIB 200 MG PO CAPS: 400.0000 mg | ORAL_CAPSULE | ORAL | Status: DC

## 2024-03-06 MED ORDER — CELECOXIB 200 MG PO CAPS: 200.0000 mg | ORAL_CAPSULE | Freq: Once | ORAL | Status: DC

## 2024-03-06 MED ORDER — CHLORHEXIDINE GLUCONATE 0.12 % MT SOLN: 15.0000 mL | Freq: Once | OROMUCOSAL | Status: DC

## 2024-03-06 MED ORDER — ORAL CARE MOUTH RINSE: 15.0000 mL | Freq: Once | OROMUCOSAL | Status: DC

## 2024-03-06 MED ORDER — ACETAMINOPHEN 500 MG PO TABS: 1000.0000 mg | ORAL_TABLET | ORAL | Status: DC

## 2024-03-06 MED ORDER — ACETAMINOPHEN 500 MG PO TABS: 1000.0000 mg | ORAL_TABLET | Freq: Once | ORAL | Status: DC

## 2024-03-06 MED ORDER — LACTATED RINGERS IV SOLN: INTRAVENOUS | Status: DC

## 2024-03-06 MED ORDER — POVIDONE-IODINE 10 % EX SWAB: 2.0000 | Freq: Once | CUTANEOUS | Status: DC

## 2024-03-07 ENCOUNTER — Ambulatory Visit: Payer: Self-pay | Admitting: Anesthesiology

## 2024-03-07 ENCOUNTER — Ambulatory Visit

## 2024-03-07 ENCOUNTER — Encounter: Admission: RE | Disposition: A | Payer: Self-pay | Attending: Obstetrics and Gynecology

## 2024-03-07 ENCOUNTER — Ambulatory Visit
Admission: RE | Admit: 2024-03-07 | Discharge: 2024-03-07 | Disposition: A | Discharge status: Home / Self Care | Attending: Obstetrics and Gynecology | Admitting: Obstetrics and Gynecology

## 2024-03-07 ENCOUNTER — Encounter: Payer: Self-pay | Admitting: Anesthesiology

## 2024-03-07 DIAGNOSIS — O034 Incomplete spontaneous abortion without complication: Secondary | ICD-10-CM | POA: Diagnosis not present

## 2024-03-07 DIAGNOSIS — Z01812 Encounter for preprocedural laboratory examination: Secondary | ICD-10-CM

## 2024-03-07 LAB — HCG, QUANTITATIVE, PREGNANCY: hCG, Beta Chain, Quant, S: 3013 m[IU]/mL — ABNORMAL HIGH (ref <5)

## 2024-03-07 SURGERY — DILATION AND EVACUATION, UTERUS
Anesthesia: Choice

## 2024-03-07 MED ORDER — LIDOCAINE HCL (PF) 2 % IJ SOLN
INTRAMUSCULAR | Status: AC
  Filled 2024-03-07: qty 5

## 2024-03-07 MED ORDER — ONDANSETRON HCL 4 MG/2ML IJ SOLN
INTRAMUSCULAR | Status: AC
  Filled 2024-03-07: qty 2

## 2024-03-07 MED ORDER — PROPOFOL 10 MG/ML IV BOLUS
INTRAVENOUS | Status: AC
  Filled 2024-03-07: qty 20

## 2024-03-07 MED ORDER — MIDAZOLAM HCL 2 MG/2ML IJ SOLN
INTRAMUSCULAR | Status: AC
  Filled 2024-03-07: qty 2

## 2024-03-07 MED ORDER — GLYCOPYRROLATE 0.2 MG/ML IJ SOLN
INTRAMUSCULAR | Status: AC
  Filled 2024-03-07: qty 1

## 2024-03-07 MED ORDER — FENTANYL CITRATE (PF) 100 MCG/2ML IJ SOLN
INTRAMUSCULAR | Status: AC
  Filled 2024-03-07: qty 2

## 2024-03-07 NOTE — Progress Notes (Signed)
 On arrival to the preop area, patient reports signfiant bleeding and cramping this weekend, now resolving to light bleeding and no pain. Stat ultrasound ordered - on my read, no pregnancy remaining in uterus, 16mm stripe with heterogenous clots consistent with recent miscarriage. Option to continue with D&C for complete reassurance vs expectant monitoring with weekly beta quants. She opted for the latter, recognizing there is a potential to need a D&C in the future if it does not complete on its own. Will schedule at outpatient lab.

## 2024-03-14 DIAGNOSIS — O021 Missed abortion: Secondary | ICD-10-CM | POA: Diagnosis not present

## 2024-03-14 DIAGNOSIS — O039 Complete or unspecified spontaneous abortion without complication: Secondary | ICD-10-CM | POA: Diagnosis not present

## 2024-03-21 DIAGNOSIS — O021 Missed abortion: Secondary | ICD-10-CM | POA: Diagnosis not present

## 2024-03-21 DIAGNOSIS — O039 Complete or unspecified spontaneous abortion without complication: Secondary | ICD-10-CM | POA: Diagnosis not present

## 2024-03-28 DIAGNOSIS — O021 Missed abortion: Secondary | ICD-10-CM | POA: Diagnosis not present

## 2024-03-28 DIAGNOSIS — O039 Complete or unspecified spontaneous abortion without complication: Secondary | ICD-10-CM | POA: Diagnosis not present
